# Patient Record
Sex: Female | Born: 1950
Health system: Southern US, Community
[De-identification: ages and names within clinical notes are randomized; demographics above are authoritative.]

## PROBLEM LIST (undated history)

## (undated) DIAGNOSIS — N201 Calculus of ureter: Secondary | ICD-10-CM

## (undated) DIAGNOSIS — G4700 Insomnia, unspecified: Secondary | ICD-10-CM

## (undated) DIAGNOSIS — I255 Ischemic cardiomyopathy: Secondary | ICD-10-CM

## (undated) DIAGNOSIS — E7800 Pure hypercholesterolemia, unspecified: Secondary | ICD-10-CM

## (undated) DIAGNOSIS — D3500 Benign neoplasm of unspecified adrenal gland: Secondary | ICD-10-CM

## (undated) DIAGNOSIS — I236 Thrombosis of atrium, auricular appendage, and ventricle as current complications following acute myocardial infarction: Secondary | ICD-10-CM

## (undated) DIAGNOSIS — F32A Depression, unspecified: Secondary | ICD-10-CM

## (undated) DIAGNOSIS — F329 Major depressive disorder, single episode, unspecified: Secondary | ICD-10-CM

## (undated) DIAGNOSIS — F988 Other specified behavioral and emotional disorders with onset usually occurring in childhood and adolescence: Secondary | ICD-10-CM

## (undated) DIAGNOSIS — M1990 Unspecified osteoarthritis, unspecified site: Secondary | ICD-10-CM

## (undated) DIAGNOSIS — I5042 Chronic combined systolic (congestive) and diastolic (congestive) heart failure: Secondary | ICD-10-CM

## (undated) DIAGNOSIS — R32 Unspecified urinary incontinence: Secondary | ICD-10-CM

## (undated) DIAGNOSIS — I2510 Atherosclerotic heart disease of native coronary artery without angina pectoris: Secondary | ICD-10-CM

## (undated) HISTORY — DX: Chronic combined systolic (congestive) and diastolic (congestive) heart failure: I50.42

## (undated) HISTORY — DX: Insomnia, unspecified: G47.00

## (undated) HISTORY — DX: Unspecified urinary incontinence: R32

## (undated) HISTORY — DX: Major depressive disorder, single episode, unspecified: F32.9

## (undated) HISTORY — DX: Ischemic cardiomyopathy: I25.5

## (undated) HISTORY — DX: Pure hypercholesterolemia, unspecified: E78.00

## (undated) HISTORY — DX: Thrombosis of atrium, auricular appendage, and ventricle as current complications following acute myocardial infarction: I23.6

## (undated) HISTORY — DX: Depression, unspecified: F32.A

## (undated) HISTORY — PX: LUMBAR DISC SURGERY: SHX700

---

## 1971-02-12 HISTORY — PX: CHOLECYSTECTOMY: SHX55

## 1994-02-11 HISTORY — PX: VAGINAL HYSTERECTOMY: SUR661

## 1994-02-11 HISTORY — PX: LUMBAR MICRODISCECTOMY: SHX99

## 1995-02-12 HISTORY — PX: LUMBAR FUSION: SHX111

## 1998-01-07 ENCOUNTER — Encounter: Payer: Self-pay | Admitting: Emergency Medicine

## 1998-01-07 ENCOUNTER — Emergency Department (HOSPITAL_COMMUNITY): Admission: EM | Admit: 1998-01-07 | Discharge: 1998-01-07 | Payer: Self-pay | Admitting: Emergency Medicine

## 1998-02-11 HISTORY — PX: CARDIAC CATHETERIZATION: SHX172

## 1998-09-13 ENCOUNTER — Other Ambulatory Visit: Admission: RE | Admit: 1998-09-13 | Discharge: 1998-09-13 | Payer: Self-pay | Admitting: *Deleted

## 1999-03-11 ENCOUNTER — Emergency Department (HOSPITAL_COMMUNITY): Admission: EM | Admit: 1999-03-11 | Discharge: 1999-03-11 | Payer: Self-pay

## 1999-08-12 ENCOUNTER — Encounter: Payer: Self-pay | Admitting: Emergency Medicine

## 1999-08-12 ENCOUNTER — Emergency Department (HOSPITAL_COMMUNITY): Admission: EM | Admit: 1999-08-12 | Discharge: 1999-08-12 | Payer: Self-pay | Admitting: Emergency Medicine

## 1999-12-19 ENCOUNTER — Other Ambulatory Visit: Admission: RE | Admit: 1999-12-19 | Discharge: 1999-12-19 | Payer: Self-pay | Admitting: *Deleted

## 2000-01-29 ENCOUNTER — Ambulatory Visit (HOSPITAL_COMMUNITY): Admission: RE | Admit: 2000-01-29 | Discharge: 2000-01-29 | Payer: Self-pay | Admitting: Internal Medicine

## 2000-01-29 ENCOUNTER — Encounter (HOSPITAL_BASED_OUTPATIENT_CLINIC_OR_DEPARTMENT_OTHER): Payer: Self-pay | Admitting: Internal Medicine

## 2001-04-28 ENCOUNTER — Emergency Department (HOSPITAL_COMMUNITY): Admission: EM | Admit: 2001-04-28 | Discharge: 2001-04-28 | Payer: Self-pay | Admitting: *Deleted

## 2001-05-01 ENCOUNTER — Emergency Department (HOSPITAL_COMMUNITY): Admission: EM | Admit: 2001-05-01 | Discharge: 2001-05-01 | Payer: Self-pay | Admitting: *Deleted

## 2001-05-05 ENCOUNTER — Emergency Department (HOSPITAL_COMMUNITY): Admission: EM | Admit: 2001-05-05 | Discharge: 2001-05-05 | Payer: Self-pay | Admitting: Emergency Medicine

## 2001-05-18 ENCOUNTER — Emergency Department (HOSPITAL_COMMUNITY): Admission: EM | Admit: 2001-05-18 | Discharge: 2001-05-18 | Payer: Self-pay | Admitting: Emergency Medicine

## 2001-05-27 ENCOUNTER — Emergency Department (HOSPITAL_COMMUNITY): Admission: EM | Admit: 2001-05-27 | Discharge: 2001-05-27 | Payer: Self-pay | Admitting: Internal Medicine

## 2002-02-11 HISTORY — PX: GASTRIC BYPASS: SHX52

## 2002-08-26 ENCOUNTER — Encounter: Admission: RE | Admit: 2002-08-26 | Discharge: 2002-11-24 | Payer: Self-pay | Admitting: Surgery

## 2002-08-31 ENCOUNTER — Encounter: Admission: RE | Admit: 2002-08-31 | Discharge: 2002-08-31 | Payer: Self-pay | Admitting: Surgery

## 2002-08-31 ENCOUNTER — Encounter: Payer: Self-pay | Admitting: Surgery

## 2002-12-06 ENCOUNTER — Encounter: Admission: RE | Admit: 2002-12-06 | Discharge: 2003-03-06 | Payer: Self-pay | Admitting: Surgery

## 2002-12-21 ENCOUNTER — Inpatient Hospital Stay (HOSPITAL_COMMUNITY): Admission: RE | Admit: 2002-12-21 | Discharge: 2002-12-24 | Payer: Self-pay | Admitting: Surgery

## 2003-02-24 ENCOUNTER — Ambulatory Visit (HOSPITAL_COMMUNITY): Admission: RE | Admit: 2003-02-24 | Discharge: 2003-02-24 | Payer: Self-pay | Admitting: Surgery

## 2003-05-10 ENCOUNTER — Encounter: Admission: RE | Admit: 2003-05-10 | Discharge: 2003-08-08 | Payer: Self-pay | Admitting: Obstetrics and Gynecology

## 2003-08-24 ENCOUNTER — Encounter: Admission: RE | Admit: 2003-08-24 | Discharge: 2003-11-22 | Payer: Self-pay | Admitting: Surgery

## 2003-12-06 ENCOUNTER — Ambulatory Visit (HOSPITAL_COMMUNITY): Admission: RE | Admit: 2003-12-06 | Discharge: 2003-12-06 | Payer: Self-pay | Admitting: Surgery

## 2003-12-13 ENCOUNTER — Encounter: Admission: RE | Admit: 2003-12-13 | Discharge: 2003-12-13 | Payer: Self-pay | Admitting: Surgery

## 2004-11-29 ENCOUNTER — Ambulatory Visit (HOSPITAL_COMMUNITY): Admission: RE | Admit: 2004-11-29 | Discharge: 2004-11-29 | Payer: Self-pay | Admitting: Surgery

## 2007-04-14 ENCOUNTER — Other Ambulatory Visit: Admission: RE | Admit: 2007-04-14 | Discharge: 2007-04-14 | Payer: Self-pay | Admitting: Obstetrics and Gynecology

## 2010-03-03 ENCOUNTER — Encounter: Payer: Self-pay | Admitting: Surgery

## 2010-03-04 ENCOUNTER — Encounter: Payer: Self-pay | Admitting: Surgery

## 2010-04-08 ENCOUNTER — Emergency Department (HOSPITAL_COMMUNITY)
Admission: EM | Admit: 2010-04-08 | Discharge: 2010-04-08 | Disposition: A | Discharge status: Home / Self Care | Payer: BC Managed Care – PPO | Attending: Emergency Medicine | Admitting: Emergency Medicine

## 2010-04-08 DIAGNOSIS — S01501A Unspecified open wound of lip, initial encounter: Secondary | ICD-10-CM | POA: Insufficient documentation

## 2010-04-08 DIAGNOSIS — W208XXA Other cause of strike by thrown, projected or falling object, initial encounter: Secondary | ICD-10-CM | POA: Insufficient documentation

## 2010-04-08 DIAGNOSIS — Y92009 Unspecified place in unspecified non-institutional (private) residence as the place of occurrence of the external cause: Secondary | ICD-10-CM | POA: Insufficient documentation

## 2010-06-29 NOTE — Discharge Summary (Signed)
NAME:  Shawna, Sparks                       ACCOUNT NO.:  [id number]   MEDICAL RECORD NO.:  [id number]                   PATIENT TYPE:  INP   LOCATION:  0465                                 FACILITY:  [facility]   PHYSICIAN:  Sandria Bales. Ezzard Standing, M.D.               DATE OF BIRTH:  [birth year]   DATE OF ADMISSION:  12/21/2002  DATE OF DISCHARGE:  12/24/2002                                 DISCHARGE SUMMARY   DISCHARGE DIAGNOSES:  1. Morbid obesity with a body mass index of approximately 42.  2. Gastroesophageal reflux disease.  3. Degenerative disk disease of lumbar and cervical spine.  4. Diabetes mellitus on oral hypoglycemics.  5. Hyperlipidemia.  6. History of depression.  7. Hypertension.   PROCEDURE:  The patient had a laparoscopic ROUX-en-Y gastrojejunostomy and  an esophagogastrotomy on December 21, 2002.   HISTORY OF PRESENT ILLNESS:  Shawna Sparks is a 60 year old white female,  patient of Dr. Jarome Matin, who is morbidly obese. Her weight is 250  pounds, her height approximately 5 feet 4 inches with an estimated BMI of  42. She has completed our bariatric program preoperative evaluation  including psychiatric evaluation, nutritional consultation and laboratory  assessments and comes to the hospital for laparoscopic ROUX-en-Y  gastrojejunostomy.   PAST MEDICAL HISTORY:  Her significant past history is that she had atypical  chest pain with a negative Cardiolite scan by Dr. Verdis Prime.  She has  recently been placed on Glucophage for type 2 diabetes mellitus. She has  gastroesophageal reflux disease, degenerative disk disease of the lumbar and  cervical spines, hypertension and depression.   HOSPITAL COURSE:  She underwent a preoperative diet and bowel prep and on  November 9, was taken to the operating room where she underwent a  laparoscopic ROUX-en-Y gastrojejunostomy. This was done antecolic,  antegastric by me and then she had an esophagogastroscopy at the same  time  by Dr. Luretha Murphy.  In management postoperatively, I asked Dr. Jarome Matin to see her with me because of her diabetes and other medical  problems.   She did well postoperatively. She had a lower extremity Doppler on the first  postoperative day which was negative for any evidence of DVT.  She had upper  GI swallow which showed no evidence of leak.   She was started on liquids and advanced. By the second postoperative day,  she had some nausea, her CBG was 135, her white blood count was 7000,  hemoglobin 10. She had some bruising at the wound sites. Her diabetes was  managed by Dr. Eloise Harman. She was encouraged to ambulate.   By the third postoperative day, she was afebrile, she was tolerating her  liquids and Glucerna well, she had minimal drainage from her Jackson-Pratt  drain and was ready for discharge.   She was given Roxicet for pain at home. She was to discontinue her insulin  and Glucophage upon discharge  and would go from Prevacid orally at home. She  could walk as tolerated. Again she is to take Glucerna q.i.d., she is to  change the dressings as needed. She will see Shawna Sparks __________, her nurse  coordinator, back on the December 27, 2002.   CONDITION ON DISCHARGE:  Good.                                               Sandria Bales. Ezzard Standing, M.D.    DHN/MEDQ  D:  01/11/2003  T:  01/11/2003  Job:  [id number]   cc:   Barry Dienes. Eloise Harman, M.D.  [address]  [place]  [place] [address]  Fax: [phone]   Lyn Records III, M.D.  [address]  [address]  [place]  [place] [address]  Fax: [phone]

## 2010-06-29 NOTE — Op Note (Signed)
NAME:  Shawna Sparks, Shawna Sparks                       ACCOUNT NO.:  [id number]   MEDICAL RECORD NO.:  [id number]                   PATIENT TYPE:  INP   LOCATION:  X001                                 FACILITY:  [facility]   PHYSICIAN:  Sandria Bales. Ezzard Standing, M.D.               DATE OF BIRTH:  [birth year]   DATE OF PROCEDURE:  12/21/2002  DATE OF DISCHARGE:                                 OPERATIVE REPORT   PREOPERATIVE DIAGNOSIS:  Morbid obesity with a body mass index of  approximately 42.   POSTOPERATIVE DIAGNOSES:  1. Morbid obesity with body mass index of 42.  2. Right upper quadrant intra-abdominal adhesions secondary to prior open     cholecystectomy.  3. Fatty infiltration of the liver.   PROCEDURE:  Laparoscopic Roux-en-Y gastrojejunostomy (antecolic,  antegastric), and esophagogastroscopy by Thornton Park. Daphine Deutscher, M.D.   SURGEON:  The main surgery was done by Sandria Bales. Ezzard Standing, M.D., and Thornton Park.  Daphine Deutscher, M.D., was the first assistant.   ANESTHESIA:  General endotracheal.   ESTIMATED BLOOD LOSS:  Minimal.   INDICATION FOR PROCEDURE:  Ms. Dragan is a 60 year old white female, a  patient of Barry Dienes. Eloise Harman, M.D., who is morbidly obese much of her adult  life.  She comes for discussion and evaluation for bariatric surgery.  She  has been through our preoperative training course.  Her weight is  approximately 240 with a height of 5 feet 4 inches, a BMI approximately 42.   Indications and potential complications explained to the patient, potential  complications include but are not limited to bleeding, infection, bowel  leak, blood clots, long-term nutritional consequences.  She understands all  this and now comes for attempted laparoscopic Roux-en-Y gastrojejunostomy.   The patient placed in supine position and given a general endotracheal  anesthetic.  She had Lovenox preoperatively and 1 g of Ancef  perioperatively.  She had PAS stockings in place, NG tube in place, and her  abdomen was prepped with Betadine solution and sterilely draped.   The patient did have some perioperative hyperglycemia, which was controlled  with an insulin drip by anesthesia.  She was not on any medicines for  diabetes preoperatively but had been told that she was a borderline diabetic  or had a glucose intolerance.   The abdomen was prepped with Betadine solution and sterilely draped.  I went  through a trocar site in the left upper quadrant using the Optiview from  [employer] and to get to the abdominal cavity without difficulty.  I then  insufflated the abdomen and carried out an exploration.  The right lobe of  the liver was obstructed by adhesions of the right upper quadrant.  The left  lobe of the liver showed fatty infiltration and was thickened.  The  remainder of the bowel was unremarkable.  I then placed the other trocars.  First I put in a right paramedian trocar to help take  down the adhesions in  the right upper quadrant and give me access to the abdominal cavity.  I then  placed a left upper abdominal paramedian 12 mm trocar, a right upper abdomen  12 mm paramedian trocar, a right subcostal 12 mm trocar, and a left lateral  5 mm trocar.   My first decision was to identify the ligament of Treitz, which was at the  base of the mesentery of the transverse colon.  I then counted the small  bowel down 40 cm and divided that with a GIA stapler quite low, 45 mm.  I  then took down the mesentery to give enough mobility to the small bowel.  I  then counted off the gastric limb, which was 100 cm in length.  I then did a  jejunojejunostomy side to side between the cut gastric limb and the jejunal  limb.  I used a stay suture of 2-0 silk suture.  I then placed an enterotomy  in both of the small bowels.  I used a 45 white load of the [employer] GIA  stapler and fired this.  I then closed the enterotomy with two running 2-0  Vicryl sutures.  I did have to reinforce two areas but I  thought I had a  tight closure at the end.  I then closed the mesentery with a running 2-0  silk suture with a Liga-Tie on both ends of this.   I then placed Tisseel fibrin glue over the anastomosis and along the  mesentery.   I then turned my attention toward the upper abdomen.  I placed a Nathanson  retractor and, again, she had a fatty left lobe of the liver, and got up  around the stomach.   First I developed the angle of His to open this area up.  Then I went along  the lesser curve of the stomach.  We tried to go 5 cm below the  gastroesophageal junction and hug the stomach in the dissection.   I entered into the lesser sac.  I then did six firings of the blue load of  the 45 mm Endo-GIA stapler, first transversely, and the other five were sort  of parallel trying to create a gastric tube.  The tube length was  approximately 5 cm in length and about 3-4 cm in width.  I then inspected.  There was no bleeding at any site in the stomach or the remnant side, and I  then placed Tisseel up in the corner around the new cardia of the stomach,  both the remnant side and the new side.   I then brought up the jejunal limb antecolic, antegastric.   I had marked the jejunal limb with a Penrose drain.  I then sewed the  jejunum to the stomach using a running 2-0 Vicryl suture.  I then cut out  the Penrose drain.  I used an Ewald tube to push into the anterior portion  of the stomach in which I would make my enterotomy.  I then made about a 1  cm hole for my enterotomy, then I made an enterotomy on the side of the  jejunum.  I then did a single firing of the 45 blue stapler.  Actually, I  tried to make the anastomosis about 2.5 or 3 cm in length.   I then closed the gastrojejunostomy with two running 2-0 Vicryl sutures.  I  then placed an anterior layer of 2-0 Vicryl sutures in a running fashion  along the whole anterior wall of the anastomosis.  At this point Dr. Daphine Deutscher went to the head  of the table and did an upper  esophagogastroscopy.  He will dictate this portion of the operation.   Some of the highlights of this is, the patient's GE junction was at about 40  cm.  The gastrojejunal anastomosis about 46 cm, there was a 6 cm pouch as  measured by endoscopy.  There was no bleeding.  The anastomosis was patent,  and we took pictures of this, and also he insufflated air into the stomach  while I flooded the upper abdomen with saline and saw no bubbles or evidence  of leak.  He will dictate the upper endoscopy.   I then irrigated out the upper abdomen.  I placed Tisseel over the anterior  aspect of the anastomosis and the cut jejunum.  I then removed the [facility]  retractor, placed a 19 Blake up into the diaphragm up under the left lobe of  the liver above the anastomosis.   At this time I removed each trocar without difficulty.  I sewed in the drain  with a 2-0 nylon suture.  I stapled each wound and placed some Steri-Strips  across the wound.   The patient tolerated the procedure well.  Her blood sugars, which had been  as high as the 300s, about 250 with the last check.  She will go to a step-  down ICU because of her blood sugars.  Her sponge and needle count were  correct at the end of the case.  She tolerated the procedure well.  Again, I  spent probably 30-45 minutes at the beginning of this case taking down the  adhesions.                                               Sandria Bales. Ezzard Standing, M.D.    DHN/MEDQ  D:  12/21/2002  T:  12/21/2002  Job:  [id number]   cc:   Barry Dienes. Eloise Harman, M.D.  [address]  [place]  [place] [address]  Fax: [phone]   Lyn Records III, M.D.  [address]  [address]  [place]  [place] [address]  Fax: [phone]

## 2010-06-29 NOTE — Consult Note (Signed)
NAME:  Shawna Sparks, Shawna Sparks                       ACCOUNT NO.:  [id number]   MEDICAL RECORD NO.:  [id number]                   PATIENT TYPE:  INP   LOCATION:  0155                                 FACILITY:  [facility]   PHYSICIAN:  Barry Dienes. Eloise Harman, M.D.            DATE OF BIRTH:  12-16-1950   DATE OF CONSULTATION:  12/21/2002  DATE OF DISCHARGE:                                   CONSULTATION   REASON FOR CONSULTATION:  Management of diabetes mellitus, type 2.   REFERRING PHYSICIAN:  Sandria Bales. Ezzard Standing, M.D.   HISTORY OF PRESENT ILLNESS:  The patient is a 60 year old white female who  is well known to me.  She is status post a gastrojejunostomy today for  morbid obesity.  She has diabetes mellitus, type 2, that had been on diet  control and started Glucophage in September 2004.  She had a hemoglobin A1c  level of 7.4% on August 18, 2002.  Her surgery went well and currently she has  the expected level of incisional site soreness, denies shortness of breath,  chest pain, nausea, or headaches.   PAST MEDICAL HISTORY:  1. Significant for diabetes mellitus, type 2, without complications.  2. Hypertension.  3. Obesity.  4. Helicobacter pylorus, 0 positivity, in July 2004.  5. Gastroesophageal reflux disease.  6. Hyperlipidemia.  7. Chronic low back pain.  8. Atypical chest pain with a July 2004 Cardiolite exercise stress test     showing normal left ventricular systolic function and no evidence of     ischemia.  9. Bilateral knee osteoarthritis.  10.      Left shoulder osteoarthritis.  11.      Depression.   MEDICATIONS PRIOR TO ADMISSION:  1. Glucophage XR 500 mg one tab p.o. daily.  2. Meridia 10 mg p.o. daily.  3. Celexa 20 mg p.o. daily.  4. Nexium 40 mg p.o. daily p.r.n.  5. Darvocet-N 100 one tab p.o. t.i.d. p.r.n. pain.  6. Restoril 30 mg p.o. q.h.s. p.r.n. sleep.   ALLERGIES:  1. CODEINE has been associated with respiratory distress.  2. VICODIN has been associated with  rash.   PAST SURGICAL HISTORY:  1. 1996, total abdominal hysterectomy.  2. 1996, lumbar 4/5 diskectomy.  3. 1997, lumbar 4/5 fusion.  4. 2000, cardiac catheterization.   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  She has no history of recent tobacco use and no history of  alcohol abuse.   REVIEW OF SYSTEMS:  See history of present illness.  She is quite happy  about having had her surgery as she has tried numerous diets and medications  to try to lose weight.   PHYSICAL EXAMINATION:  VITAL SIGNS:  Blood pressure 120/80, pulse 92,  respirations 20, temperature 98.4.  GENERAL:  In general she is an overweight white female who was sleepy, but  quite easily arouseable.  HEENT:  Within normal limits.  NECK:  Without jugular venous distention or carotid bruit.  CHEST:  Clear to auscultation.  HEART:  Had a regular rate and rhythm without murmur, gallop, or rub.  ABDOMEN:  Had normal bowel sounds and no hepatosplenomegaly.  EXTREMITIES:  Were without peripheral edema and her feet were sensate to  light touch.   LABORATORY DATA:  Capillary blood glucose levels today were 215, 198, and  171.  Currently she is on IV insulin at 1.7 units per hour.   OTHER MEDICATIONS:  1. Zofran IV as needed for nausea.  2. Dilaudid IV as needed for pain.  3. Half normal saline with 20 mEq of potassium per liter at 125 ml per hour.  4. Ancef q.8h. x3 doses.   IMPRESSION/PLAN:  1. Diabetes mellitus, type 2:  She recently started medications with a rise     in her hemoglobin A1c level.  For now we will convert her to low dose     Lantus with sliding scale insulin as needed.  At 48 hours following     surgery she could restart Glucophage.  Given her gastric reduction     surgery we will need to order the liquid form of Glucophage at relatively     low dose of 250 mg once daily.  2. Gastroesophageal reflux disease:  Stable.  Her symptoms could worsen,     given  her surgery.  Plan to start IV Protonix and continue regular dosing     of Nexium as an outpatient.  3. Osteoarthritis:  Stable on her current regimen.                                               Barry Dienes Eloise Harman, M.D.    DGP/MEDQ  D:  12/21/2002  T:  12/21/2002  Job:  [id number]

## 2010-06-29 NOTE — Op Note (Signed)
   NAME:  BARNEY, GERTSCH                       ACCOUNT NO.:  [id number]   MEDICAL RECORD NO.:  [id number]                   PATIENT TYPE:  INP   LOCATION:  X001                                 FACILITY:  [facility]   PHYSICIAN:  Thornton Park. Daphine Deutscher, M.D.             DATE OF BIRTH:  23-Sep-1950   DATE OF PROCEDURE:  12/21/2002  DATE OF DISCHARGE:                                 OPERATIVE REPORT   PREOPERATIVE DIAGNOSIS:  Morbid obesity, Roux-en-Y gastric bypass in  progress.   ENDOSCOPIST:  Thornton Park. Daphine Deutscher, M.D.   ANESTHESIA:  General.   INDICATIONS:  Wren Glazer is a 60 year old lady undergoing a  laparoscopic Roux-Y gastric bypass and after completion of the  gastrojejunostomy, I went up to the head of the table and inserted the  esophagogastroscope.  This passed into the esophagus under visualization  where the esophagus appeared to be relatively normal.  The EG junction was  at 40 cm from her lips and then the scope was passed into a pouch.  We  insufflated and Dr. Ezzard Standing filled up the abdomen with some saline. There was  no evidence of any bubbles inside the abdomen.  On the inside, there was no  intraluminal evidence of bleeding.  The anastomosis appeared widely patent  and again was examined from within.  Pictures of the anastomosis were taken.  The insufflation was removed and the scope was withdrawn.  The pouch length  appeared to be 6 cm by measurement from the esophagogastric junction.                                               Thornton Park Daphine Deutscher, M.D.    MBM/MEDQ  D:  12/21/2002  T:  12/21/2002  Job:  [id number]

## 2010-09-17 ENCOUNTER — Other Ambulatory Visit (HOSPITAL_COMMUNITY): Payer: Self-pay | Admitting: Orthopedic Surgery

## 2010-09-17 DIAGNOSIS — S20219A Contusion of unspecified front wall of thorax, initial encounter: Secondary | ICD-10-CM

## 2010-09-17 DIAGNOSIS — R0602 Shortness of breath: Secondary | ICD-10-CM

## 2010-09-18 ENCOUNTER — Encounter (HOSPITAL_COMMUNITY): Payer: Self-pay

## 2010-09-18 ENCOUNTER — Other Ambulatory Visit (HOSPITAL_COMMUNITY): Payer: Self-pay | Admitting: Orthopedic Surgery

## 2010-09-18 ENCOUNTER — Ambulatory Visit (HOSPITAL_COMMUNITY)
Admission: RE | Admit: 2010-09-18 | Discharge: 2010-09-18 | Disposition: A | Discharge status: Home / Self Care | Payer: BC Managed Care – PPO | Source: Ambulatory Visit | Attending: Orthopedic Surgery | Admitting: Orthopedic Surgery

## 2010-09-18 DIAGNOSIS — X58XXXA Exposure to other specified factors, initial encounter: Secondary | ICD-10-CM | POA: Insufficient documentation

## 2010-09-18 DIAGNOSIS — S20219A Contusion of unspecified front wall of thorax, initial encounter: Secondary | ICD-10-CM

## 2010-09-18 DIAGNOSIS — E119 Type 2 diabetes mellitus without complications: Secondary | ICD-10-CM | POA: Insufficient documentation

## 2010-09-18 DIAGNOSIS — R0602 Shortness of breath: Secondary | ICD-10-CM

## 2010-09-18 DIAGNOSIS — N289 Disorder of kidney and ureter, unspecified: Secondary | ICD-10-CM | POA: Insufficient documentation

## 2010-09-18 DIAGNOSIS — R079 Chest pain, unspecified: Secondary | ICD-10-CM | POA: Insufficient documentation

## 2010-09-18 HISTORY — DX: Benign neoplasm of unspecified adrenal gland: D35.00

## 2010-09-18 HISTORY — DX: Other specified behavioral and emotional disorders with onset usually occurring in childhood and adolescence: F98.8

## 2010-09-18 MED ORDER — IOHEXOL 300 MG/ML  SOLN
100.0000 mL | Freq: Once | INTRAMUSCULAR | Status: AC | PRN
  Administered 2010-09-18: 100 mL via INTRAVENOUS

## 2010-10-08 ENCOUNTER — Encounter: Payer: Self-pay | Admitting: Cardiovascular Disease

## 2010-10-09 ENCOUNTER — Ambulatory Visit: Payer: BC Managed Care – PPO | Admitting: Cardiovascular Disease

## 2010-12-20 ENCOUNTER — Emergency Department (HOSPITAL_COMMUNITY): Payer: BC Managed Care – PPO

## 2010-12-20 ENCOUNTER — Encounter (HOSPITAL_COMMUNITY): Payer: Self-pay | Admitting: *Deleted

## 2010-12-20 ENCOUNTER — Emergency Department (HOSPITAL_COMMUNITY)
Admission: EM | Admit: 2010-12-20 | Discharge: 2010-12-20 | Disposition: A | Discharge status: Home / Self Care | Payer: BC Managed Care – PPO | Attending: Emergency Medicine | Admitting: Emergency Medicine

## 2010-12-20 DIAGNOSIS — Z9884 Bariatric surgery status: Secondary | ICD-10-CM | POA: Insufficient documentation

## 2010-12-20 DIAGNOSIS — N201 Calculus of ureter: Secondary | ICD-10-CM | POA: Insufficient documentation

## 2010-12-20 DIAGNOSIS — R109 Unspecified abdominal pain: Secondary | ICD-10-CM | POA: Insufficient documentation

## 2010-12-20 DIAGNOSIS — N200 Calculus of kidney: Secondary | ICD-10-CM

## 2010-12-20 DIAGNOSIS — K5730 Diverticulosis of large intestine without perforation or abscess without bleeding: Secondary | ICD-10-CM | POA: Insufficient documentation

## 2010-12-20 DIAGNOSIS — N1330 Unspecified hydronephrosis: Secondary | ICD-10-CM | POA: Insufficient documentation

## 2010-12-20 LAB — URINALYSIS, ROUTINE W REFLEX MICROSCOPIC
Glucose, UA: >1000 mg/dL — AB
Ketones, ur: 40 mg/dL — AB
Nitrite: NEGATIVE
Protein, ur: 100 mg/dL — AB
Specific Gravity, Urine: 1.034 — ABNORMAL HIGH (ref 1.005–1.030)
Urobilinogen, UA: 1 mg/dL (ref 0.0–1.0)
pH: 5.5 (ref 5.0–8.0)

## 2010-12-20 LAB — URINE MICROSCOPIC-ADD ON

## 2010-12-20 LAB — POCT I-STAT, CHEM 8
BUN: 20 mg/dL (ref 6–23)
Calcium, Ion: 1.13 mmol/L (ref 1.12–1.32)
Chloride: 104 mEq/L (ref 96–112)
Creatinine, Ser: 0.9 mg/dL (ref 0.50–1.10)
Glucose, Bld: 286 mg/dL — ABNORMAL HIGH (ref 70–99)
HCT: 43 % (ref 36.0–46.0)
Hemoglobin: 14.6 g/dL (ref 12.0–15.0)
Potassium: 3.8 mEq/L (ref 3.5–5.1)
Sodium: 139 mEq/L (ref 135–145)
TCO2: 25 mmol/L (ref 0–100)

## 2010-12-20 MED ORDER — ONDANSETRON HCL 4 MG PO TABS: 4.0000 mg | ORAL_TABLET | Freq: Three times a day (TID) | ORAL | Status: DC | PRN

## 2010-12-20 MED ORDER — ONDANSETRON 8 MG PO TBDP
8.0000 mg | ORAL_TABLET | Freq: Once | ORAL | Status: AC
  Administered 2010-12-20: 8 mg via ORAL
  Filled 2010-12-20: qty 1

## 2010-12-20 MED ORDER — IBUPROFEN 800 MG PO TABS: 800.0000 mg | ORAL_TABLET | Freq: Three times a day (TID) | ORAL | Status: DC

## 2010-12-20 MED ORDER — MORPHINE SULFATE CR 15 MG PO TB12: ORAL_TABLET | ORAL | Status: DC

## 2010-12-20 MED ORDER — MORPHINE SULFATE 4 MG/ML IJ SOLN: 8.0000 mg | Freq: Once | INTRAMUSCULAR | Status: DC

## 2010-12-20 MED ORDER — MORPHINE SULFATE 4 MG/ML IJ SOLN
4.0000 mg | Freq: Once | INTRAMUSCULAR | Status: AC
  Administered 2010-12-20: 4 mg via INTRAVENOUS
  Filled 2010-12-20: qty 1

## 2010-12-20 MED ORDER — KETOROLAC TROMETHAMINE 30 MG/ML IJ SOLN
30.0000 mg | Freq: Once | INTRAMUSCULAR | Status: AC
  Administered 2010-12-20: 30 mg via INTRAVENOUS
  Filled 2010-12-20: qty 1

## 2010-12-20 MED ORDER — ONDANSETRON HCL 4 MG PO TABS: 4.0000 mg | ORAL_TABLET | Freq: Four times a day (QID) | ORAL | Status: DC

## 2010-12-20 MED ORDER — MORPHINE SULFATE 4 MG/ML IJ SOLN
8.0000 mg | Freq: Once | INTRAMUSCULAR | Status: AC
  Administered 2010-12-20: 8 mg via INTRAMUSCULAR
  Filled 2010-12-20: qty 2

## 2010-12-20 NOTE — ED Notes (Signed)
Patient stable and being discharged home with her daughter.  Patient is walking to car with family.  Patient stable.

## 2010-12-20 NOTE — ED Notes (Signed)
[place], PA notified of pt's continued pain. States she will go see pt shortly.

## 2010-12-20 NOTE — ED Provider Notes (Signed)
Medical screening examination/treatment/procedure(s) were performed by non-physician practitioner and as supervising physician I was immediately available for consultation/collaboration.   Geoffery Lyons, MD 12/20/10 1534

## 2010-12-20 NOTE — ED Provider Notes (Signed)
History     CSN: [id number] Arrival date & time: 12/20/2010  2:17 PM   None     Chief Complaint  Patient presents with  . Flank Pain    (Consider location/radiation/quality/duration/timing/severity/associated sxs/prior treatment) HPI  Past Medical History  Diagnosis Date  . Attention deficit disorder (ADD) t  . Adrenal adenoma t  . Diabetes mellitus   . Depression     Past Surgical History  Procedure Date  . Cardiac catheterization   . Gastric bypass   . Cholecystectomy     No family history on file.  History  Substance Use Topics  . Smoking status: Never Smoker   . Smokeless tobacco: Not on file  . Alcohol Use: Yes     occassional    OB History    Grav Para Term Preterm Abortions TAB SAB Ect Mult Living                  Review of Systems  Allergies  Codeine; Dilaudid; and Tramadol  Home Medications   Current Outpatient Rx  Name Route Sig Dispense Refill  . CITALOPRAM HYDROBROMIDE 40 MG PO TABS Oral Take 80 mg by mouth daily.     . INSULIN DETEMIR 100 UNIT/ML [place] SOLN Subcutaneous Inject 10 Units into the skin at bedtime.     Marland Kitchen LISDEXAMFETAMINE DIMESYLATE 60 MG PO CAPS Oral Take 60 mg by mouth every morning.     Marland Kitchen ZOLPIDEM TARTRATE 10 MG PO TABS Oral Take 10 mg by mouth at bedtime as needed.       BP 173/86  Pulse 67  Temp(Src) 97.9 F (36.6 C) (Oral)  Resp 18  Wt 198 lb (89.812 kg)  SpO2 100%  Physical Exam  ED Course  Procedures (including critical care time)  Labs Reviewed - No data to display No results found.   No diagnosis found.    MDM  Screened for L flank pain since this am.  Nausea and vomiting in triage.  Pain 10/10.  Will gave morphine 8 IM and zofran 8 ODT.       Jethro Bastos, NP 12/20/10 1445

## 2010-12-20 NOTE — ED Provider Notes (Signed)
History     CSN: [id number] Arrival date & time: 12/20/2010  2:17 PM   First MD Initiated Contact with Patient 12/20/10 1559      Chief Complaint  Patient presents with  . Flank Pain    (Consider location/radiation/quality/duration/timing/severity/associated sxs/prior treatment) HPI   Patient is states she has a long-standing history of stone within her right kidney that has been followed by Dr. Annabell Howells in the past presents to emergency department complaining a waxing and waning left flank that started last week that was associated with blood in her urine that she went to go see Dr. Annabell Howells in his office and states she had a KUB at that time did not show aforementioned stone. Patient states the pain resolved until this morning with gradual onset recurrence of left flank pain with increasing pain throughout the day and increasing severity. Patient states due to numerous allergies to narcotic pain meds, she is only able to have morphine. Patient states she uses Tylenol and ibuprofen at home for pain but states this has not improved her pain at all. Denies aggravating or alleviating factors. Denies fevers, chills, abdominal pain, nausea, vomiting, diarrhea, dysuria.  Past Medical History  Diagnosis Date  . Attention deficit disorder (ADD) t  . Adrenal adenoma t  . Diabetes mellitus   . Depression     Past Surgical History  Procedure Date  . Cardiac catheterization   . Gastric bypass   . Cholecystectomy     No family history on file.  History  Substance Use Topics  . Smoking status: Never Smoker   . Smokeless tobacco: Not on file  . Alcohol Use: Yes     occassional    OB History    Grav Para Term Preterm Abortions TAB SAB Ect Mult Living                  Review of Systems  All other systems reviewed and are negative.    Allergies  Codeine; Dilaudid; and Tramadol  Home Medications   Current Outpatient Rx  Name Route Sig Dispense Refill  . CITALOPRAM HYDROBROMIDE 40  MG PO TABS Oral Take 80 mg by mouth daily.     . INSULIN DETEMIR 100 UNIT/ML [place] SOLN Subcutaneous Inject 10 Units into the skin at bedtime.     Marland Kitchen LISDEXAMFETAMINE DIMESYLATE 60 MG PO CAPS Oral Take 60 mg by mouth every morning.     Marland Kitchen ZOLPIDEM TARTRATE 10 MG PO TABS Oral Take 10 mg by mouth at bedtime as needed.       BP 173/86  Pulse 67  Temp(Src) 97.9 F (36.6 C) (Oral)  Resp 18  Wt 198 lb (89.812 kg)  SpO2 100%  Physical Exam  Nursing note and vitals reviewed. Constitutional: She is oriented to person, place, and time. She appears well-developed and well-nourished. No distress.  HENT:  Head: Normocephalic and atraumatic.  Eyes: Conjunctivae and EOM are normal. Pupils are equal, round, and reactive to light.  Neck: Normal range of motion. Neck supple.  Cardiovascular: Normal rate, regular rhythm, normal heart sounds and intact distal pulses.  Exam reveals no gallop and no friction rub.   No murmur heard. Pulmonary/Chest: Effort normal and breath sounds normal. No respiratory distress. She has no wheezes. She has no rales. She exhibits no tenderness.  Abdominal: Bowel sounds are normal. She exhibits no distension and no mass. There is no tenderness. There is CVA tenderness. There is no rebound and no guarding.  Left CVA tenderness to palpation  Musculoskeletal: Normal range of motion. She exhibits no edema and no tenderness.  Neurological: She is alert and oriented to person, place, and time.  Skin: Skin is warm and dry. No rash noted. She is not diaphoretic. No erythema.  Psychiatric: She has a normal mood and affect.    ED Course  Procedures (including critical care time)  5:36 PM Dr.Ottelin, return consult call and are reviewed CT findings with him. Based on CT findings he suggests giving patient's pain controlled and having her followup with Dr. Annabell Howells in office on tomorrow. We'll discuss with patient.  Labs Reviewed  URINALYSIS, ROUTINE W REFLEX MICROSCOPIC - Abnormal;  Notable for the following:    Color, Urine RED (*) BIOCHEMICALS MAY BE AFFECTED BY COLOR   Appearance TURBID (*)    Specific Gravity, Urine 1.034 (*)    Glucose, UA >1000 (*)    Hgb urine dipstick LARGE (*)    Bilirubin Urine MODERATE (*)    Ketones, ur 40 (*)    Protein, ur 100 (*)    Leukocytes, UA SMALL (*)    All other components within normal limits  POCT I-STAT, CHEM 8 - Abnormal; Notable for the following:    Glucose, Bld 286 (*)    All other components within normal limits  URINE MICROSCOPIC-ADD ON - Abnormal; Notable for the following:    Bacteria, UA FEW (*)    Crystals CA OXALATE CRYSTALS (*)    All other components within normal limits   Ct Abdomen Pelvis Wo Contrast  12/20/2010  *RADIOLOGY REPORT*  Clinical Data: Left flank pain.  History of urinary tract calculi. Surgical history includes gastric bypass, cholecystectomy, hysterectomy, and lumbar fusion.  CT ABDOMEN AND PELVIS WITHOUT CONTRAST 12/20/2010:  Technique:  Multidetector CT imaging of the abdomen and pelvis was performed following the standard protocol without intravenous contrast.  Comparison: CT abdomen and pelvis 11/29/2004, and CT abdomen 02/24/2003 and 12/06/2003 [facility].  Findings: Moderate to severe left hydronephrosis and dilation of the proximal left ureter due to an obstructing approximate 7 mm calculus in the mid left ureter.  Left renal edema and left perinephric edema.  No left renal calculi.  No right urinary tract calculi or obstruction.  Stable simple cyst in the mid right kidney; within the limits of the unenhanced technique, no significant focal parenchymal abnormality involving either kidney.  Slight interval increase in size of the low attenuation right adrenal mass, current measurements approximating 3.7 x 2.9 cm (series 2, image 23), previously 3.4 x 2.4 cm on 11/29/2004.  Mass measures 0-12 HU.  Normal unenhanced appearance of the spleen, pancreas, and left adrenal gland.  Moderate  aorto-iliac atherosclerosis without aneurysm.  No significant lymphadenopathy.  Prior gastric bypass with widely patent gastrojejunal anastomosis. Focal dilation of the small bowel at the jejunojejunal anastomosis, indicative of a focal atonic segment, as expected.  Remainder of the small bowel normal in appearance.  Diffuse colonic diverticulosis without evidence of acute diverticulitis.  Normal appendix in the right upper pelvis.  No ascites.  Urinary bladder decompressed and unremarkable.  Uterus surgically absent.  No adnexal masses or free pelvic fluid.  Phleboliths low in the pelvis.  Bone window images demonstrate prior bone graft donor site in the right iliac bone, prior L4-5 fusion without complications, and mild osteopenia.  Visualized lung bases clear. Heart size normal.  IMPRESSION:  1.  Obstructing approximate 7 mm mid left ureteral calculus. 2.  No urinary tract calculi elsewhere  on either side. 3.  Slight interval increase in size of the right adrenal adenoma since October, 2006, measured above. 4.  Diffuse colonic diverticulosis without evidence of acute diverticulitis. 6.  Prior gastric bypass procedure without complications.  Original Report Authenticated By: Arnell Sieving, M.D.     1. Kidney stone   2. Hydronephrosis of left kidney       MDM  7 mm obstructing stone that was discussed with Dr. Irene Pap in both labs and CT findings of patient's pain well controlled here in the ER and follow up with Dr. Annabell Howells in office established. Patient is afebrile in no acute distress.        Lenon Oms [place], [place] 12/21/10 [phone]

## 2010-12-20 NOTE — ED Notes (Signed)
[place], PA notified of pt's continued pain and request to be updated by her.  States she will be in to speak with pt shortly. NAD noted at this time.

## 2010-12-20 NOTE — ED Notes (Signed)
Pt states "saw Dr. Annabell Howells last Monday, I have a hx of kidney stones"; pt is crying & moaning, holding left flank.

## 2010-12-21 ENCOUNTER — Other Ambulatory Visit: Payer: Self-pay | Admitting: Urology

## 2010-12-21 DIAGNOSIS — N201 Calculus of ureter: Secondary | ICD-10-CM | POA: Insufficient documentation

## 2010-12-21 NOTE — ED Provider Notes (Signed)
Evaluation and management procedures were performed by the PA/NP under my supervision/collaboration.    Faylene Allerton D Fujie Dickison, MD 12/21/10 1431 

## 2010-12-22 ENCOUNTER — Encounter (HOSPITAL_COMMUNITY): Payer: Self-pay | Admitting: Emergency Medicine

## 2010-12-26 ENCOUNTER — Encounter (HOSPITAL_BASED_OUTPATIENT_CLINIC_OR_DEPARTMENT_OTHER): Payer: Self-pay | Admitting: *Deleted

## 2010-12-26 ENCOUNTER — Other Ambulatory Visit: Payer: Self-pay | Admitting: Urology

## 2010-12-26 NOTE — H&P (Signed)
Problems  1. Ureteral Stone Left 592.1  History of Present Illness     Shawna Sparks is a 60 yo WF with a 5-33mm left mid ureteral stone.   She had to return to the ER after her initial visit with me and a CT was done that demonstrated the stone.  She has had recurrent pain and is to have a left ureterescopic stone extraction.   Past Medical History Problems  1. History of  Adrenal Cortical Adenoma Right 227.0 2. History of  Arthritis V13.4 3. History of  Depression 311 4. History of  Diabetes Mellitus 250.00 5. History of  Hypercholesterolemia 272.0  Surgical History Problems  1. History of  Cholecystectomy 2. History of  Gastric Surgery 3. History of  Gastric Surgery For Morbid Obesity Gastric Bypass 4. History of  Hysterectomy V45.77 5. History of  Lumbar Vertebral Fusion  Current Meds 1. Ambien 10 MG Oral Tablet; Therapy: (Recorded:05Nov2012) to 2. CeleXA 40 MG Oral Tablet; Therapy: (Recorded:05Nov2012) to 3. Levemir FlexPen 100 UNIT/ML Subcutaneous Solution; Therapy: (Recorded:05Nov2012) to 4. MetFORMIN HCl 500 MG Oral Tablet; Therapy: (Recorded:05Nov2012) to  Allergies Medication  1. Codeine Derivatives 2. HYDROmorphone HCl TABS 3. Tramadol  Family History Problems  1. Sororal history of  Death In The Family Father 2. Sororal history of  Death In The Family Mother 3. Paternal history of  Diabetes Mellitus V18.0 4. Maternal history of  Diabetes Mellitus V18.0 5. Fraternal history of  Diabetes Mellitus V18.0 6. Sororal history of  Diabetes Mellitus V18.0 7. Sororal history of  Diabetes Mellitus V18.0 8. Sororal history of  Diabetes Mellitus V18.0 9. Family history of  Family Health Status Number Of Children 10. Sororal history of  Heart Disease V17.49 11. Sororal history of  Renal Failure  Social History Problems    Alcohol Use   Caffeine Use   Marital History - Divorced V61.03   Never A Smoker   Occupation:   She works at [employer].   Review of  Systems Genitourinary, constitutional, skin, eye, otolaryngeal, hematologic/lymphatic, cardiovascular, pulmonary, endocrine, musculoskeletal, gastrointestinal, neurological and psychiatric system(s) were reviewed and pertinent findings if present are noted.  Genitourinary: nocturia.  Hematologic/Lymphatic: a tendency to easily bruise.  Cardiovascular: leg swelling.  Musculoskeletal: back pain and joint pain.    Vitals Vital Signs [Data Includes: Last 1 Day]  05Nov2012 03:54PM  BMI Calculated: 34.15 BSA Calculated: 1.96 Height: 5 ft 4 in Weight: 200 lb  Blood Pressure: 143 / 94 Temperature: 98.8 F Heart Rate: 96  Physical Exam Constitutional: Well nourished and well developed . No acute distress.  ENT:. The ears and nose are normal in appearance.  Neck: The appearance of the neck is normal and no neck mass is present.  Pulmonary: No respiratory distress and normal respiratory rhythm and effort.  Cardiovascular: Heart rate and rhythm are normal . No peripheral edema.  Abdomen: The abdomen is obese. The abdomen is soft and nontender (with mild SP tenderness). No masses are palpated. No CVA tenderness. No hernias are palpable. No hepatosplenomegaly noted.  Skin: Normal skin turgor, no visible rash and no visible skin lesions.  Neuro/Psych:. Mood and affect are appropriate.    Results/Data  .    Assessment Assessed  1. Ureteral Stone Left 592.1   She has a probable left ureteral stone with intermittant symptoms.   Plan Health Maintenance (V70.0)  1. UA With REFLEX  Done: 05Nov2012 Ureteral Stone (592.1)  2. Tamsulosin HCl 0.4 MG Oral Capsule; TAKE 1 CAPSULE Daily with meals;  Therapy: 05Nov2012  to (Evaluate:05Dec2012); Last Rx:05Nov2012 3. KUB  Done: 05Nov2012 12:00AM 4. Follow-up MD/NP/PA Office  Follow-up  Requested for: 05Nov2012   She is to have a left ureteroscopic stone extraction.  I have reviewed the risks including but not limited to bleeding, infection, ureteral  injury, need for stent or second procedures, thrombotic events and anesthetic complications.  I have outlined the potential succcess rate of the procedure.   Discussion/Summary  CC: Dr. Dossie Arbour.

## 2010-12-26 NOTE — Progress Notes (Signed)
NPO after MN with exception clear liquids until 0830. Pt to arrive 1315. Needs istat and EKG.  May take morphine and zofran if needed w/ sip of water

## 2010-12-27 ENCOUNTER — Ambulatory Visit (HOSPITAL_BASED_OUTPATIENT_CLINIC_OR_DEPARTMENT_OTHER): Payer: BC Managed Care – PPO | Admitting: Anesthesiology

## 2010-12-27 ENCOUNTER — Encounter (HOSPITAL_BASED_OUTPATIENT_CLINIC_OR_DEPARTMENT_OTHER): Payer: Self-pay | Admitting: Anesthesiology

## 2010-12-27 ENCOUNTER — Encounter (HOSPITAL_BASED_OUTPATIENT_CLINIC_OR_DEPARTMENT_OTHER)
Admission: RE | Disposition: A | Discharge status: Home / Self Care | Payer: Self-pay | Source: Ambulatory Visit | Attending: Urology

## 2010-12-27 ENCOUNTER — Other Ambulatory Visit: Payer: Self-pay

## 2010-12-27 ENCOUNTER — Ambulatory Visit (HOSPITAL_BASED_OUTPATIENT_CLINIC_OR_DEPARTMENT_OTHER)
Admission: RE | Admit: 2010-12-27 | Discharge: 2010-12-27 | Disposition: A | Discharge status: Home / Self Care | Payer: BC Managed Care – PPO | Source: Ambulatory Visit | Attending: Urology | Admitting: Urology

## 2010-12-27 DIAGNOSIS — N201 Calculus of ureter: Secondary | ICD-10-CM | POA: Diagnosis present

## 2010-12-27 DIAGNOSIS — E7800 Pure hypercholesterolemia, unspecified: Secondary | ICD-10-CM | POA: Insufficient documentation

## 2010-12-27 DIAGNOSIS — F3289 Other specified depressive episodes: Secondary | ICD-10-CM | POA: Insufficient documentation

## 2010-12-27 DIAGNOSIS — F329 Major depressive disorder, single episode, unspecified: Secondary | ICD-10-CM | POA: Insufficient documentation

## 2010-12-27 DIAGNOSIS — Z79899 Other long term (current) drug therapy: Secondary | ICD-10-CM | POA: Insufficient documentation

## 2010-12-27 DIAGNOSIS — Z8739 Personal history of other diseases of the musculoskeletal system and connective tissue: Secondary | ICD-10-CM | POA: Insufficient documentation

## 2010-12-27 DIAGNOSIS — Z90710 Acquired absence of both cervix and uterus: Secondary | ICD-10-CM | POA: Insufficient documentation

## 2010-12-27 DIAGNOSIS — E119 Type 2 diabetes mellitus without complications: Secondary | ICD-10-CM | POA: Insufficient documentation

## 2010-12-27 HISTORY — DX: Unspecified osteoarthritis, unspecified site: M19.90

## 2010-12-27 HISTORY — PX: CYSTOSCOPY/RETROGRADE/URETEROSCOPY: SHX5316

## 2010-12-27 HISTORY — DX: Calculus of ureter: N20.1

## 2010-12-27 LAB — POCT I-STAT 4, (NA,K, GLUC, HGB,HCT)
HCT: 42 % (ref 36.0–46.0)
Sodium: 142 mEq/L (ref 135–145)

## 2010-12-27 SURGERY — CYSTOSCOPY/RETROGRADE/URETEROSCOPY
Anesthesia: Choice

## 2010-12-27 MED ORDER — LACTATED RINGERS IV SOLN
INTRAVENOUS | Status: DC | PRN
  Administered 2010-12-27: 14:00:00 via INTRAVENOUS

## 2010-12-27 MED ORDER — DEXAMETHASONE SODIUM PHOSPHATE 4 MG/ML IJ SOLN
INTRAMUSCULAR | Status: DC | PRN
  Administered 2010-12-27: 4 mg via INTRAVENOUS

## 2010-12-27 MED ORDER — KETOROLAC TROMETHAMINE 30 MG/ML IJ SOLN
INTRAMUSCULAR | Status: DC | PRN
  Administered 2010-12-27: 30 mg via INTRAVENOUS

## 2010-12-27 MED ORDER — BELLADONNA ALKALOIDS-OPIUM 16.2-60 MG RE SUPP
RECTAL | Status: DC | PRN
  Administered 2010-12-27: 1 via RECTAL

## 2010-12-27 MED ORDER — MIDAZOLAM HCL 5 MG/5ML IJ SOLN
INTRAMUSCULAR | Status: DC | PRN
  Administered 2010-12-27: 2 mg via INTRAVENOUS

## 2010-12-27 MED ORDER — IOHEXOL 350 MG/ML SOLN
INTRAVENOUS | Status: DC | PRN
  Administered 2010-12-27: 50 mL via INTRATHECAL

## 2010-12-27 MED ORDER — URIBEL 118 MG PO CAPS: 1.0000 | ORAL_CAPSULE | Freq: Four times a day (QID) | ORAL | Status: DC | PRN

## 2010-12-27 MED ORDER — LACTATED RINGERS IV SOLN
INTRAVENOUS | Status: DC
  Administered 2010-12-27: 14:00:00 via INTRAVENOUS

## 2010-12-27 MED ORDER — FENTANYL CITRATE 0.05 MG/ML IJ SOLN
INTRAMUSCULAR | Status: DC | PRN
  Administered 2010-12-27 (×2): 25 ug via INTRAVENOUS
  Administered 2010-12-27: 50 ug via INTRAVENOUS
  Administered 2010-12-27: 25 ug via INTRAVENOUS
  Administered 2010-12-27: 50 ug via INTRAVENOUS
  Administered 2010-12-27: 25 ug via INTRAVENOUS

## 2010-12-27 MED ORDER — LIDOCAINE HCL (CARDIAC) 20 MG/ML IV SOLN
INTRAVENOUS | Status: DC | PRN
  Administered 2010-12-27: 100 mg via INTRAVENOUS

## 2010-12-27 MED ORDER — PROMETHAZINE HCL 25 MG/ML IJ SOLN: 6.2500 mg | INTRAMUSCULAR | Status: DC | PRN

## 2010-12-27 MED ORDER — PROPOFOL 10 MG/ML IV EMUL
INTRAVENOUS | Status: DC | PRN
  Administered 2010-12-27: 200 mg via INTRAVENOUS

## 2010-12-27 MED ORDER — FENTANYL CITRATE 0.05 MG/ML IJ SOLN
25.0000 ug | INTRAMUSCULAR | Status: DC | PRN
  Administered 2010-12-27: 25 ug via INTRAVENOUS

## 2010-12-27 MED ORDER — ONDANSETRON HCL 4 MG/2ML IJ SOLN
INTRAMUSCULAR | Status: DC | PRN
  Administered 2010-12-27: 4 mg via INTRAVENOUS

## 2010-12-27 MED ORDER — SODIUM CHLORIDE 0.9 % IR SOLN
Status: DC | PRN
  Administered 2010-12-27: 1

## 2010-12-27 MED ORDER — CIPROFLOXACIN IN D5W 400 MG/200ML IV SOLN
400.0000 mg | INTRAVENOUS | Status: AC
  Administered 2010-12-27: 400 mg via INTRAVENOUS

## 2010-12-27 SURGICAL SUPPLY — 24 items
BAG DRAIN URO-CYSTO SKYTR STRL (DRAIN) ×2 IMPLANT
BAG DRN UROCATH (DRAIN) ×1
BASKET ZERO TIP NITINOL 2.4FR (BASKET) ×1 IMPLANT
BSKT STON RTRVL ZERO TP 2.4FR (BASKET) ×1
CANISTER SUCT LVC 12 LTR MEDI- (MISCELLANEOUS) ×1 IMPLANT
CATH URET 5FR 28IN CONE TIP (BALLOONS)
CATH URET 5FR 28IN OPEN ENDED (CATHETERS) ×2 IMPLANT
CATH URET 5FR 70CM CONE TIP (BALLOONS) IMPLANT
CLOTH BEACON ORANGE TIMEOUT ST (SAFETY) ×2 IMPLANT
DRAPE CAMERA CLOSED 9X96 (DRAPES) ×2 IMPLANT
GLOVE BIOGEL PI IND STRL 6.5 (GLOVE) IMPLANT
GLOVE BIOGEL PI INDICATOR 6.5 (GLOVE) ×2
GLOVE SURG SS PI 8.0 STRL IVOR (GLOVE) ×2 IMPLANT
GOWN STRL REIN XL XLG (GOWN DISPOSABLE) ×2 IMPLANT
GOWN XL W/COTTON TOWEL STD (GOWNS) ×2 IMPLANT
GUIDEWIRE 0.038 PTFE COATED (WIRE) IMPLANT
GUIDEWIRE ANG ZIPWIRE 038X150 (WIRE) IMPLANT
GUIDEWIRE STR DUAL SENSOR (WIRE) ×2 IMPLANT
IV NS IRRIG 3000ML ARTHROMATIC (IV SOLUTION) ×4 IMPLANT
LASER FIBER DISP (UROLOGICAL SUPPLIES) ×1 IMPLANT
NS IRRIG 500ML POUR BTL (IV SOLUTION) IMPLANT
PACK CYSTOSCOPY (CUSTOM PROCEDURE TRAY) ×2 IMPLANT
SHEATH URET ACCESS 12FR/35CM (UROLOGICAL SUPPLIES) ×1 IMPLANT
STENT URET 6FRX24 CONTOUR (STENTS) ×1 IMPLANT

## 2010-12-27 NOTE — Transfer of Care (Signed)
Immediate Anesthesia Transfer of Care Note  Patient: Shawna Sparks  Procedure(s) Performed:  CYSTOSCOPY/RETROGRADE/URETEROSCOPY - cystoscopy left retrograde ureterocopy stone exstraction poss holmium laser  Patient Location: PACU  Anesthesia Type: General  Level of Consciousness: awake, sedated, patient cooperative and responds to stimulation  Airway & Oxygen Therapy: Patient Spontanous Breathing and Patient connected to face mask oxygen  Post-op Assessment: Report given to PACU RN, Post -op Vital signs reviewed and stable and Patient moving all extremities  Post vital signs: Reviewed and stable  Complications: No apparent anesthesia complications

## 2010-12-27 NOTE — Anesthesia Preprocedure Evaluation (Addendum)
Anesthesia Evaluation  Patient identified by MRN, date of birth, ID band Patient awake    Reviewed: Allergy & Precautions, H&P , NPO status , Patient's Chart, lab work & pertinent test results, reviewed documented beta blocker date and time   History of Anesthesia Complications (+) PONV  Airway Mallampati: II TM Distance: >3 FB Neck ROM: full    Dental No notable dental hx. (+)    Pulmonary neg pulmonary ROS,  clear to auscultation  Pulmonary exam normal       Cardiovascular Exercise Tolerance: Good neg cardio ROS regular Normal Prolonged QT   Neuro/Psych PSYCHIATRIC DISORDERS Negative Neurological ROS  Negative Psych ROS   GI/Hepatic negative GI ROS, Neg liver ROS,   Endo/Other  Negative Endocrine ROSDiabetes mellitus-, Well Controlled, Type 2, Insulin Dependent  Renal/GU negative Renal ROS  Genitourinary negative   Musculoskeletal   Abdominal   Peds  Hematology negative hematology ROS (+)   Anesthesia Other Findings   Reproductive/Obstetrics negative OB ROS                         Anesthesia Physical Anesthesia Plan  ASA: III  Anesthesia Plan: General   Post-op Pain Management:    Induction: Intravenous  Airway Management Planned: LMA  Additional Equipment:   Intra-op Plan:   Post-operative Plan:   Informed Consent: I have reviewed the patients History and Physical, chart, labs and discussed the procedure including the risks, benefits and alternatives for the proposed anesthesia with the patient or authorized representative who has indicated his/her understanding and acceptance.   Dental Advisory Given  Plan Discussed with: CRNA and Surgeon  Anesthesia Plan Comments:        Anesthesia Quick Evaluation

## 2010-12-27 NOTE — Anesthesia Postprocedure Evaluation (Signed)
  Anesthesia Post-op Note  Patient: Shawna Sparks  Procedure(s) Performed:  CYSTOSCOPY/RETROGRADE/URETEROSCOPY - cystoscopy left retrograde ureterocopy stone exstraction poss holmium laser  Patient Location: PACU  Anesthesia Type: General  Level of Consciousness: awake and alert   Airway and Oxygen Therapy: Patient Spontanous Breathing  Post-op Pain: mild  Post-op Assessment: Post-op Vital signs reviewed, Patient's Cardiovascular Status Stable, Respiratory Function Stable, Patent Airway and No signs of Nausea or vomiting  Post-op Vital Signs: stable  Complications: No apparent anesthesia complications

## 2010-12-27 NOTE — Anesthesia Procedure Notes (Addendum)
Procedure Name: LMA Insertion Date/Time: 12/27/2010 2:32 PM Performed by: Iline Oven Pre-anesthesia Checklist: Patient identified, Emergency Drugs available, Suction available and Patient being monitored Patient Re-evaluated:Patient Re-evaluated prior to inductionOxygen Delivery Method: Circle System Utilized Preoxygenation: Pre-oxygenation with 100% oxygen Intubation Type: IV induction Ventilation: Mask ventilation without difficulty LMA: LMA inserted LMA Size: 4.0 Number of attempts: 1 Airway Equipment and Method: bite block Placement Confirmation: positive ETCO2 Tube secured with: Tape Dental Injury: Teeth and Oropharynx as per pre-operative assessment

## 2010-12-27 NOTE — Interval H&P Note (Signed)
History and Physical Interval Note:   12/27/2010   1:49 PM   Mickie Bail Hannig  has presented today for surgery, with the diagnosis of left ureteral stone  The various methods of treatment have been discussed with the patient and family. After consideration of risks, benefits and other options for treatment, the patient has consented to  Procedure(s): CYSTOSCOPY/RETROGRADE/URETEROSCOPY as a surgical intervention .  The patients' history has been reviewed, patient examined, no change in status, stable for surgery.  I have reviewed the patients' chart and labs.  Questions were answered to the patient's satisfaction.     Anner Crete  MD

## 2010-12-27 NOTE — Brief Op Note (Signed)
12/27/2010  3:15 PM  PATIENT:  Shawna Sparks  60 y.o. female  PRE-OPERATIVE DIAGNOSIS:  Left ureteral stone  POST-OPERATIVE DIAGNOSIS:  Left ureteral stone  PROCEDURE:  Procedure(s): CYSTOSCOPY/RETROGRADE/URETEROSCOPY WITH HOLMIUM LASERTRIPSY AND INSERTION OF LEFT JJ STENT.  SURGEON:  Surgeon(s): Anner Crete  PHYSICIAN ASSISTANT:   ASSISTANTS: none   ANESTHESIA:   general  EBL:  Total I/O In: 600 [I.V.:600] Out: -   BLOOD ADMINISTERED:none  DRAINS: 6x24 left JJ stent.   LOCAL MEDICATIONS USED:  NONE  SPECIMEN:  Source of Specimen:  stone fragments  DISPOSITION OF SPECIMEN:  To patient  COUNTS:  YES  TOURNIQUET:  * No tourniquets in log *  DICTATION: .Other Dictation: Dictation Number [id number]  PLAN OF CARE: Discharge to home after PACU  PATIENT DISPOSITION:  PACU - hemodynamically stable.   Delay start of Pharmacological VTE agent (>24hrs) due to surgical blood loss or risk of bleeding:  {YES/NO/NOT APPLICABLE:20182

## 2010-12-27 NOTE — Interval H&P Note (Signed)
History and Physical Interval Note:   12/27/2010   7:32 AM   Shawna Sparks  has presented today for surgery, with the diagnosis of left ureteral stone  The various methods of treatment have been discussed with the patient and family. After consideration of risks, benefits and other options for treatment, the patient has consented to  Procedure(s): CYSTOSCOPY/RETROGRADE/URETEROSCOPY as a surgical intervention .  The patients' history has been reviewed, patient examined, no change in status, stable for surgery.  I have reviewed the patients' chart and labs.  Questions were answered to the patient's satisfaction.     Anner Crete  MD

## 2010-12-28 NOTE — Op Note (Signed)
NAMEKIKUYE, KORENEK NO.:  [id number]  MEDICAL RECORD NO.:  [id number]  LOCATION:                               FACILITY:  [facility]  PHYSICIAN:  Excell Seltzer. Annabell Howells, M.D.    DATE OF BIRTH:  1950/09/22  DATE OF PROCEDURE:  12/28/2010 DATE OF DISCHARGE:                              OPERATIVE REPORT   The patient of Dr. Excell Seltzer. Josefine Fuhr.  PROCEDURES:  Cystoscopy, left retrograde pyelogram with interpretation, left ureteroscopic stone extraction with holmium laser lithotripsy, and insertion of 6-French 24 cm left double-J stent.  PREOPERATIVE DIAGNOSIS:  Left midureteral stones.  POSTOPERATIVE DIAGNOSIS:  Left mid and distal ureteral stones.  SURGEON:  Excell Seltzer. Annabell Howells, M.D.  ANESTHESIA:  General.  DRAINS:  6-French 24 cm double-J stent.  SPECIMENS:  Stone fragments given to the patient.  COMPLICATIONS:  None.  INDICATIONS:  Shawna Sparks is a 60 year old white female with a left midureteral stone seen on CT scan who has had persistent pain and hematuria.  She has elected to ureteroscopic stone extraction.  FINDINGS OF PROCEDURE:  She was given Cipro.  She was taken to the operating room, where general anesthetic was induced.  She was placed in lithotomy position.  Her perineum and genitalia were prepped with Betadine solution.  She was draped in usual sterile fashion.  Time-out was performed.  Cystoscopy was performed using a 22-French scope and 12 degree lens. Examination revealed a normal urethra.  The bladder wall was smooth and pale without tumor, stones, or inflammation.  The ureteral orifices were unremarkable.  The left ureteral orifice was cannulated with 5-French open-end catheter, and contrast was instilled.  This revealed a filling defect in the distal ureter, consistent with a stone seen on CT scan.  A guidewire was then passed by the stone to the kidney and the cystoscope was removed.  A 12-French introducer sheath inner core dilator was then  passed by the stone.  The 6.5-French short ureteroscope was then passed alongside the wire. The stone was visualized in an initial attempt to grasp it and remove it with a basket, was unsuccessful, so the basket was disengaged.  Further inspection of the ureter also revealed a second stone in the midureter. A 365 micron laser fiber with a holmium set on 0.5 watts and 20 Hz was then used to fragment both stones.  Once both stones were fragmented, a Nitinol basket was used to remove all significant fragments, some small dust and sand were left that were too small to grasp with the basket.  At this point, the ureteroscope was removed.  The cystoscope was reinserted over the wire and a 6-French 24 cm double-J stent was inserted to the kidney.  Under fluoroscopic guidance, the wire was removed leaving good coil in the kidney, a good coil in the bladder. The bladder was drained.  The scope was removed leaving the stent string exiting urethra.  The drapes removed.  The stent string was secured to the patient's inner thigh.  She was taken down from lithotomy position.  Her anesthetic was reversed.  She was moved to recovery room in stable condition.  There were no complications.     Jonny Ruiz  Ranelle Oyster, M.D.     JJW/MEDQ  D:  12/27/2010  T:  12/27/2010  Job:  [id number]  cc:   Barry Dienes. Eloise Harman, M.D. Fax: [phone]

## 2011-01-01 ENCOUNTER — Encounter (HOSPITAL_BASED_OUTPATIENT_CLINIC_OR_DEPARTMENT_OTHER): Payer: Self-pay | Admitting: Urology

## 2011-07-22 ENCOUNTER — Other Ambulatory Visit: Payer: Self-pay | Admitting: Orthopedic Surgery

## 2011-07-22 ENCOUNTER — Ambulatory Visit
Admission: RE | Admit: 2011-07-22 | Discharge: 2011-07-22 | Disposition: A | Discharge status: Home / Self Care | Payer: BC Managed Care – PPO | Source: Ambulatory Visit | Attending: Orthopedic Surgery | Admitting: Orthopedic Surgery

## 2011-07-22 DIAGNOSIS — M542 Cervicalgia: Secondary | ICD-10-CM

## 2011-07-22 DIAGNOSIS — M5410 Radiculopathy, site unspecified: Secondary | ICD-10-CM

## 2011-07-23 ENCOUNTER — Inpatient Hospital Stay: Admission: RE | Admit: 2011-07-23 | Payer: BC Managed Care – PPO | Source: Ambulatory Visit

## 2012-07-17 ENCOUNTER — Other Ambulatory Visit: Payer: Self-pay | Admitting: Internal Medicine

## 2012-07-17 DIAGNOSIS — Z1231 Encounter for screening mammogram for malignant neoplasm of breast: Secondary | ICD-10-CM

## 2012-08-19 ENCOUNTER — Ambulatory Visit: Payer: BC Managed Care – PPO

## 2014-03-08 ENCOUNTER — Other Ambulatory Visit: Payer: Self-pay | Admitting: Gastroenterology

## 2014-11-25 ENCOUNTER — Ambulatory Visit: Payer: Self-pay | Admitting: Neurology

## 2014-11-28 ENCOUNTER — Ambulatory Visit: Payer: Self-pay | Admitting: Neurology

## 2014-11-29 ENCOUNTER — Ambulatory Visit (INDEPENDENT_AMBULATORY_CARE_PROVIDER_SITE_OTHER): Payer: 59 | Admitting: Neurology

## 2014-11-29 ENCOUNTER — Encounter: Payer: Self-pay | Admitting: Neurology

## 2014-11-29 VITALS — BP 130/96 | HR 101 | Ht 64.0 in | Wt 175.0 lb

## 2014-11-29 DIAGNOSIS — M5412 Radiculopathy, cervical region: Secondary | ICD-10-CM

## 2014-11-29 DIAGNOSIS — E1165 Type 2 diabetes mellitus with hyperglycemia: Secondary | ICD-10-CM

## 2014-11-29 DIAGNOSIS — G255 Other chorea: Secondary | ICD-10-CM

## 2014-11-29 DIAGNOSIS — M542 Cervicalgia: Secondary | ICD-10-CM | POA: Diagnosis not present

## 2014-11-29 DIAGNOSIS — F1010 Alcohol abuse, uncomplicated: Secondary | ICD-10-CM

## 2014-11-29 DIAGNOSIS — R253 Fasciculation: Secondary | ICD-10-CM | POA: Diagnosis not present

## 2014-11-29 DIAGNOSIS — Z794 Long term (current) use of insulin: Secondary | ICD-10-CM

## 2014-11-29 DIAGNOSIS — IMO0002 Reserved for concepts with insufficient information to code with codable children: Secondary | ICD-10-CM

## 2014-11-29 MED ORDER — DIAZEPAM 5 MG PO TABS: ORAL_TABLET | ORAL | Status: DC

## 2014-11-29 NOTE — Progress Notes (Signed)
Note routed to Dr. Philip Aspen.

## 2014-11-29 NOTE — Progress Notes (Signed)
Shawna Sparks was seen today in the movement disorders clinic for neurologic consultation at the request of Donnajean Lopes, MD.  The consultation is for the evaluation of tremor since late July.  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  It is only in the L hand.  It is mostly with use.  She states that it will move non-rhythmically and it is not painful.  She has trouble typing.  She is right hand dominant.  She has some neck pain and states that she has a C6-7 issue.  States that she will go to a Restaurant manager, fast food.  Last imaging of the neck was years ago.  States that she was told previously by friends that she has a neck tremor x years (associated that with celexa and now on Lexapro).  The records that were made available to me were reviewed.  She saw [facility] first because she was having L hand pain.  They did an EMG (I don't have those results) and it apparently only showed mild L CTS.    Tremor: Yes.     Affected by caffeine:  No. (few cups coffee at work)  Affected by alcohol:  No. (drinks at least 8-10 oz vodka or corona light a day - admits drinks too much)  Affected by stress:  No.  Affected by fatigue:  Yes.    Spills soup if on spoon:  No. but states that she can't carry things with that hand  Spills glass of liquid if full:  Yes.    Affects ADL's (tying shoes, brushing teeth, etc):  Yes.   but because the hand is "clumsy"  Other Specific Symptoms:  Voice: more hoarse, softer than in the past Sleep: trouble staying and getting to sleep  Vivid Dreams:  No.  Acting out dreams:  No. Wet Pillows: No. Postural symptoms:  Yes.   (just a little off)  Falls?  Yes.    Bradykinesia symptoms: difficulty regaining balance Loss of smell:  Yes.   Loss of taste:  Yes.   Urinary Incontinence:  No. Difficulty Swallowing:  No. Handwriting, micrographia: No., but handwriting has changed due to cramping Depression:  Yes.   Memory changes:  Yes.  , trouble  with short term memory Hallucinations:  No.  visual distortions: No. N/V:  No. Lightheaded:  No.  Syncope: No. Diplopia:  No. Dyskinesia:  No.  Neuroimaging has not previously been performed.    ALLERGIES:   Allergies  Allergen Reactions  . Codeine Shortness Of Breath  . Ambien [Zolpidem Tartrate] Itching  . Benadryl [Diphenhydramine Hcl (Sleep)] Itching  . Dilaudid [Hydromorphone Hcl] Rash  . Tramadol Rash    CURRENT MEDICATIONS:  Outpatient Encounter Prescriptions as of 11/29/2014  Medication Sig  . escitalopram (LEXAPRO) 20 MG tablet TK 1 T PO QD  . fluconazole (DIFLUCAN) 150 MG tablet TK 1 T PO QD  . lisdexamfetamine (VYVANSE) 60 MG capsule Take 60 mg by mouth every morning.   Marland Kitchen NOVOLOG 100 UNIT/ML injection TK BEFORE EACH MEAL BY SLIDING SCALE  . vitamin B-12 (CYANOCOBALAMIN) 1000 MCG tablet Take 1,000 mcg by mouth daily.  . Vitamin D, Ergocalciferol, (DRISDOL) 50000 UNITS CAPS capsule TK ONE C PO Q WEEK  . zolpidem (AMBIEN CR) 12.5 MG CR tablet TK 1 T PO QD HS PRF SLEEP  . [DISCONTINUED] sertraline (ZOLOFT) 25 MG tablet Take 25 mg by mouth daily.  . [DISCONTINUED] zolpidem (AMBIEN) 10 MG tablet Take 10 mg  by mouth at bedtime.   . diazepam (VALIUM) 5 MG tablet Take one tablet by mouth 30 minutes prior to MR and one additional if needed at time of MR  . [DISCONTINUED] citalopram (CELEXA) 40 MG tablet Take 40 mg by mouth daily.   . [DISCONTINUED] insulin detemir (LEVEMIR) 100 UNIT/ML injection Inject 10 Units into the skin at bedtime.   . [DISCONTINUED] Meth-Hyo-M Bl-Na Phos-Ph Sal (URIBEL) 118 MG CAPS Take 1 capsule (118 mg total) by mouth 4 (four) times daily as needed (bladder irritation).  . [DISCONTINUED] morphine (MS CONTIN) 15 MG 12 hr tablet 15 mg as needed. One-two tablets my mouth every 8 hours as needed for pain   . [DISCONTINUED] ondansetron (ZOFRAN) 4 MG tablet Take 1 tablet (4 mg total) by mouth every 8 (eight) hours as needed for nausea.   No  facility-administered encounter medications on file as of 11/29/2014.    PAST MEDICAL HISTORY:   Past Medical History  Diagnosis Date  . Attention deficit disorder (ADD) t  . Adrenal adenoma right    accidental finding 6 yrs ago-   . Depression   . Diabetes mellitus     insulin  . Arthritis     knees  . Ureteral stone left    PAST SURGICAL HISTORY:   Past Surgical History  Procedure Laterality Date  . Gastric bypass  2004  . Vaginal hysterectomy  1996  . Cholecystectomy  1973  . Cardiac catheterization  2000    wnl  . Lumbar microdiscectomy  1996    L4 - 5  . Lumbar fusion  1997    L 4 - 5  . Cystoscopy/retrograde/ureteroscopy  12/27/2010    Procedure: CYSTOSCOPY/RETROGRADE/URETEROSCOPY;  Surgeon: Malka So;  Location: [facility];  Service: Urology;  Laterality: N/A;  cystoscopy left retrograde ureterocopy stone exstraction poss holmium laser    SOCIAL HISTORY:   Social History   Social History  . Marital Status: Married    Spouse Name: N/A  . Number of Children: N/A  . Years of Education: N/A   Occupational History  . nurse    Social History Main Topics  . Smoking status: Never Smoker   . Smokeless tobacco: Never Used  . Alcohol Use: 0.0 oz/week    0 Standard drinks or equivalent per week     Comment: 8-12 oz a day  . Drug Use: No  . Sexual Activity: Not on file   Other Topics Concern  . Not on file   Social History Narrative    FAMILY HISTORY:   Family Status  Relation Status Death Age  . Mother Deceased     heart disease  . Father Deceased     heart disease, pancreatic cancer  . Brother Deceased     ? MI  . Brother Deceased   . Sister Deceased     sepsis, cardiac transplant  . Child Alive     3, healthy    ROS:  Admits to noncompliant with diet/DM care and DM very uncontrolled.  A complete 10 system review of systems was obtained and was unremarkable apart from what is mentioned above.  PHYSICAL EXAMINATION:     VITALS:   Filed Vitals:   11/29/14 1012  BP: 130/96  Pulse: 101  Height: 5\' 4"  (1.626 m)  Weight: 175 lb (79.379 kg)    GEN:  The patient appears stated age and is in NAD.  Is sweating excessively HEENT:  Normocephalic, atraumatic.  The mucous membranes  are very dry.  No tongue fasciculations. The superficial temporal arteries are without ropiness or tenderness. CV:  RRR Lungs:  CTAB Neck/HEME:  There are no carotid bruits bilaterally.  Neurological examination:  Orientation: The patient is alert and oriented x3. Fund of knowledge is appropriate.  Recent and remote memory are intact.  Attention and concentration are normal.    Able to name objects and repeat phrases. Cranial nerves: There is good facial symmetry. Pupils are equal round and reactive to light bilaterally. Fundoscopic exam reveals clear margins bilaterally. Extraocular muscles are intact. The visual fields are full to confrontational testing. The speech is fluent and clear. Soft palate rises symmetrically and there is no tongue deviation. Hearing is intact to conversational tone. Sensation: Sensation is intact to light and pinprick throughout (facial, trunk, extremities). Vibration is intact at the bilateral big toe. There is no extinction with double simultaneous stimulation. There is no sensory dermatomal level identified. Motor: Strength is 5/5 in the bilateral upper and lower extremities.   Shoulder shrug is equal and symmetric.  There is no pronator drift.  There are a few rare scattered fasciculations over the deltoids bilaterally and rarely in the gastroc.  Did not see fasciculations in back, chest, tongue. Deep tendon reflexes: Deep tendon reflexes are 2-/4 at the bilateral biceps, triceps, brachioradialis, patella and absent at bilateral achilles. Plantar responses are downgoing bilaterally.  Movement examination: Tone: There is normal tone in the bilateral upper extremities.  The tone in the lower extremities is  normal.  Abnormal movements: There is an irregular head tremor.  There is no tremor of the hands.  There is an irregular choreiform like movement of the fingers on the L when she types and intermittently when the hand is held antigravity.   Coordination:  There is no decremation with RAM's, with any form of RAMS, including alternating supination and pronation of the forearm, hand opening and closing, finger taps, heel taps and toe taps. Gait and Station: The patient has no difficulty arising out of a deep-seated chair without the use of the hands. The patient's stride length is normal with normal arm swing and no strange posturing of the arm with ambulation.  Able to stand in romberg position without difficulty  ASSESSMENT/PLAN:  1.  Abnormal movements of the left upper extremity that appear to be somewhat choreiform in nature and came on acutely  -Generally, acute onset of choreiform like movements come from structural lesions.  I have seen this from diabetic hypoglycemia as well as hyperglycemia causing basal ganglia lesions and we will do an MRI of the brain.  She also reports significant neck pain and we will do an MRI of the cervical spine.  -She reports that she recently had lab work from her primary care physician and I will get a copy of those before doing further lab work, but if not already done so, she will need a CBC, CMP, ceruloplasmin, copper, LFTs, TSH, PTH, ferritin, sedimentation rate, ANA, antiphospholipid antibody, lupus anticoagulant, RPR, B12, antigliadin antibody.  Because she has some random fasciculations we will also make sure that we get calcium, phosphorus, SPEP/UPEP with immunofixation.  Will hold on paraneoplastic Ab's and HD panel for now given cost and given acute onset of sx's.    -I know that she just had EMG via ortho office for CTS/radiculopathy, but if we don't get an answer with above testing, may need to repeat in a few months with more detailed EMG. 2.  EtOH  overuse/abuse  -  long discussion re: importance of tapering EtOH 3.  Uncontrolled DM  -talked about importance of proper diet and exercise. 4.  Much greater than 50% of this visit was spent in counseling with the patient.  Total face to face time:  60 min

## 2014-11-29 NOTE — Patient Instructions (Signed)
1. We have scheduled you at [facility] for your OPEN MRI on 12/05/14 at 10:00 am. Please arrive 30 minutes prior and go to [place]. If you need to reschedule for any reason please call [phone].

## 2014-12-02 ENCOUNTER — Telehealth: Payer: Self-pay | Admitting: Neurology

## 2014-12-02 DIAGNOSIS — G255 Other chorea: Secondary | ICD-10-CM

## 2014-12-02 DIAGNOSIS — E538 Deficiency of other specified B group vitamins: Secondary | ICD-10-CM

## 2014-12-02 DIAGNOSIS — R253 Fasciculation: Secondary | ICD-10-CM

## 2014-12-02 NOTE — Telephone Encounter (Signed)
Patient made aware she needs labs drawn. Orders placed. She will stop by our office to get on the lab schedule for [facility] prior to going for labs. She will call with any questions.

## 2014-12-02 NOTE — Telephone Encounter (Signed)
-----   Message from [place], DO sent at 12/01/2014  6:10 PM EDT ----- See me to get labs ordered for her.  Basically needs everything.

## 2014-12-05 ENCOUNTER — Ambulatory Visit: Payer: Self-pay | Admitting: Neurology

## 2014-12-06 ENCOUNTER — Telehealth: Payer: Self-pay | Admitting: Neurology

## 2014-12-06 NOTE — Telephone Encounter (Signed)
I spoke with Lawanda at [facility] and she said that patient has hx of resp distress with codeine so they would not give her the contrast.

## 2014-12-06 NOTE — Telephone Encounter (Signed)
Shawna Sparks, will you call [facility] and find out why they didn't do MRI brain with contrast?  I ordered with and without.

## 2014-12-07 ENCOUNTER — Other Ambulatory Visit (INDEPENDENT_AMBULATORY_CARE_PROVIDER_SITE_OTHER): Payer: 59

## 2014-12-07 DIAGNOSIS — E538 Deficiency of other specified B group vitamins: Secondary | ICD-10-CM

## 2014-12-07 DIAGNOSIS — R253 Fasciculation: Secondary | ICD-10-CM | POA: Diagnosis not present

## 2014-12-07 DIAGNOSIS — G255 Other chorea: Secondary | ICD-10-CM

## 2014-12-07 LAB — HEPATIC FUNCTION PANEL
ALK PHOS: 61 U/L (ref 39–117)
ALT: 15 U/L (ref 0–35)
AST: 13 U/L (ref 0–37)
Albumin: 4.5 g/dL (ref 3.5–5.2)
BILIRUBIN DIRECT: 0.1 mg/dL (ref 0.0–0.3)
BILIRUBIN TOTAL: 0.7 mg/dL (ref 0.2–1.2)
TOTAL PROTEIN: 7.6 g/dL (ref 6.0–8.3)

## 2014-12-07 LAB — COMPREHENSIVE METABOLIC PANEL
ALK PHOS: 61 U/L (ref 39–117)
ALT: 15 U/L (ref 0–35)
AST: 13 U/L (ref 0–37)
Albumin: 4.5 g/dL (ref 3.5–5.2)
BILIRUBIN TOTAL: 0.7 mg/dL (ref 0.2–1.2)
BUN: 21 mg/dL (ref 6–23)
CALCIUM: 10 mg/dL (ref 8.4–10.5)
CO2: 20 meq/L (ref 19–32)
Chloride: 98 mEq/L (ref 96–112)
Creatinine, Ser: 0.73 mg/dL (ref 0.40–1.20)
GFR: 85.24 mL/min (ref >60.00)
Glucose, Bld: 373 mg/dL — ABNORMAL HIGH (ref 70–99)
POTASSIUM: 3.5 meq/L (ref 3.5–5.1)
Sodium: 134 mEq/L — ABNORMAL LOW (ref 135–145)
TOTAL PROTEIN: 7.6 g/dL (ref 6.0–8.3)

## 2014-12-07 LAB — PHOSPHORUS: PHOSPHORUS: 5 mg/dL — AB (ref 2.3–4.6)

## 2014-12-07 LAB — C-REACTIVE PROTEIN: CRP: 0.6 mg/dL (ref 0.5–20.0)

## 2014-12-07 LAB — CBC WITH DIFFERENTIAL/PLATELET
BASOS ABS: 0 10*3/uL (ref 0.0–0.1)
Basophils Relative: 0.5 % (ref 0.0–3.0)
EOS PCT: 0.6 % (ref 0.0–5.0)
Eosinophils Absolute: 0 10*3/uL (ref 0.0–0.7)
HEMATOCRIT: 50.3 % — AB (ref 36.0–46.0)
Hemoglobin: 17.3 g/dL — ABNORMAL HIGH (ref 12.0–15.0)
LYMPHS ABS: 1.3 10*3/uL (ref 0.7–4.0)
LYMPHS PCT: 20.3 % (ref 12.0–46.0)
MCHC: 34.3 g/dL (ref 30.0–36.0)
MCV: 90.1 fl (ref 78.0–100.0)
MONOS PCT: 9.1 % (ref 3.0–12.0)
Monocytes Absolute: 0.6 10*3/uL (ref 0.1–1.0)
NEUTROS ABS: 4.4 10*3/uL (ref 1.4–7.7)
NEUTROS PCT: 69.5 % (ref 43.0–77.0)
PLATELETS: 264 10*3/uL (ref 150.0–400.0)
RBC: 5.59 Mil/uL — ABNORMAL HIGH (ref 3.87–5.11)
RDW: 12.9 % (ref 11.5–15.5)
WBC: 6.4 10*3/uL (ref 4.0–10.5)

## 2014-12-07 LAB — SEDIMENTATION RATE: Sed Rate: 10 mm/hr (ref 0–22)

## 2014-12-07 LAB — VITAMIN B12: VITAMIN B 12: 401 pg/mL (ref 211–911)

## 2014-12-07 LAB — TSH: TSH: 2.16 u[IU]/mL (ref 0.35–4.50)

## 2014-12-07 LAB — FERRITIN: Ferritin: 33.2 ng/mL (ref 10.0–291.0)

## 2014-12-07 LAB — CALCIUM: CALCIUM: 10 mg/dL (ref 8.4–10.5)

## 2014-12-08 LAB — GLIADIN ANTIBODIES, SERUM
Gliadin IgA: 11 Units (ref <20)
Gliadin IgG: 1 Units (ref <20)

## 2014-12-08 LAB — PTH, INTACT AND CALCIUM
CALCIUM: 9.7 mg/dL (ref 8.4–10.5)
PTH: 35 pg/mL (ref 14–64)

## 2014-12-08 LAB — ANA: ANA: NEGATIVE

## 2014-12-08 LAB — RPR

## 2014-12-09 LAB — UIFE/LIGHT CHAINS/TP QN, 24-HR UR
Albumin, U: DETECTED
Alpha 1, Urine: DETECTED — AB
Alpha 2, Urine: DETECTED — AB
BETA UR: DETECTED — AB
GAMMA UR: DETECTED — AB
Total Protein, Urine: 7 mg/dL (ref 5–24)

## 2014-12-09 LAB — RFX PTT-LA W/RFX TO HEX PHASE CONF: PTT-LA SCREEN: 29 s (ref <=40)

## 2014-12-09 LAB — SPEP & IFE WITH QIG
ALPHA-1-GLOBULIN: 0.3 g/dL (ref 0.2–0.3)
ALPHA-2-GLOBULIN: 0.7 g/dL (ref 0.5–0.9)
Albumin ELP: 4.8 g/dL (ref 3.8–4.8)
Beta 2: 0.4 g/dL (ref 0.2–0.5)
Beta Globulin: 0.6 g/dL (ref 0.4–0.6)
GAMMA GLOBULIN: 0.6 g/dL — AB (ref 0.8–1.7)
IGM, SERUM: 79 mg/dL (ref 52–322)
IgA: 280 mg/dL (ref 69–380)
IgG (Immunoglobin G), Serum: 661 mg/dL — ABNORMAL LOW (ref 690–1700)
Total Protein, Serum Electrophoresis: 7.3 g/dL (ref 6.1–8.1)

## 2014-12-09 LAB — ANTIPHOSPHOLOPID AB PANEL
Anticardiolipin IgA: <11 [APL'U]
Beta-2 Glyco I IgG: <9 SGU (ref <=20)
Beta-2-Glycoprotein I IgA: <9 SAU (ref <=20)
Beta-2-Glycoprotein I IgM: <9 SMU (ref <=20)
Phosphatydalserine, IgA: <20 U/mL (ref <20)
Phosphatydalserine, IgM: <25 U/mL (ref <25)

## 2014-12-09 LAB — RFX DRVVT SCR W/RFLX CONF 1:1 MIX: dRVVT Screen: 25 s (ref <=45)

## 2014-12-09 LAB — LUPUS ANTICOAGULANT PANEL

## 2014-12-09 LAB — CERULOPLASMIN: Ceruloplasmin: 35 mg/dL (ref 18–53)

## 2014-12-10 LAB — COPPER, SERUM: COPPER: 142 ug/dL (ref 70–175)

## 2014-12-28 ENCOUNTER — Telehealth: Payer: Self-pay | Admitting: Neurology

## 2014-12-28 NOTE — Telephone Encounter (Signed)
Patient had done at [facility]. Please advise.

## 2014-12-28 NOTE — Telephone Encounter (Signed)
MRI brain - mod number of T2 hyperintensities but no reason for the movements.  However, they did not do the contrast like I asked so could be missing something.  MRI cervical spine - has a disc protrusion at C6-7 and while that may cause neck pain and hyperreflexia, it would not cause movements.

## 2014-12-28 NOTE — Telephone Encounter (Signed)
Patient made aware of MR results.  

## 2014-12-28 NOTE — Telephone Encounter (Signed)
PT called and wanted to know the results of her MRI/Dawn CB# [phone]

## 2015-01-27 ENCOUNTER — Encounter: Payer: Self-pay | Admitting: Neurology

## 2015-01-30 ENCOUNTER — Telehealth: Payer: Self-pay | Admitting: Neurology

## 2015-01-30 NOTE — Telephone Encounter (Signed)
Patient dismissed from [facility] by Wells Guiles Tat DO , effective January 27, 2015. Dismissal letter sent out by certified / registered mail. DAJ  Received signed domestic return receipt verifying delivery of certified letter on March 15, 2015. Article number [id number]

## 2015-05-11 ENCOUNTER — Ambulatory Visit (INDEPENDENT_AMBULATORY_CARE_PROVIDER_SITE_OTHER): Payer: 59 | Admitting: Neurology

## 2015-05-11 ENCOUNTER — Encounter: Payer: Self-pay | Admitting: Neurology

## 2015-05-11 VITALS — BP 114/79 | HR 89 | Resp 16 | Ht 64.0 in | Wt 174.0 lb

## 2015-05-11 DIAGNOSIS — R351 Nocturia: Secondary | ICD-10-CM

## 2015-05-11 DIAGNOSIS — F19982 Other psychoactive substance use, unspecified with psychoactive substance-induced sleep disorder: Secondary | ICD-10-CM | POA: Diagnosis not present

## 2015-05-11 DIAGNOSIS — IMO0002 Reserved for concepts with insufficient information to code with codable children: Secondary | ICD-10-CM

## 2015-05-11 DIAGNOSIS — R682 Dry mouth, unspecified: Secondary | ICD-10-CM

## 2015-05-11 DIAGNOSIS — G4726 Circadian rhythm sleep disorder, shift work type: Secondary | ICD-10-CM

## 2015-05-11 MED ORDER — TRAZODONE HCL 100 MG PO TABS: 100.0000 mg | ORAL_TABLET | Freq: Every day | ORAL | Status: DC

## 2015-05-11 MED ORDER — LISDEXAMFETAMINE DIMESYLATE 60 MG PO CAPS: 60.0000 mg | ORAL_CAPSULE | ORAL | Status: DC

## 2015-05-11 MED ORDER — ZOLPIDEM TARTRATE 10 MG PO TABS: ORAL_TABLET | ORAL | Status: DC

## 2015-05-11 NOTE — Progress Notes (Signed)
SLEEP MEDICINE CLINIC   Provider:  Larey Sparks, M D  Referring Provider: Leanna Battles, MD Primary Care Physician:  Shawna Lopes, MD  Chief Complaint  Patient presents with  . Insomnia    Rm 10. Sleep Consult. Patient states that she has taken Ambien over 5 years and it no longer works for her. She states that she has not slept in last 48 hours. Patient has trouble falling and staying sleep, Works nights 8p-8a.     HPI:  Shawna Sparks , BSN and MSN is a 65 y.o. female , seen here as a referral from Dr. Philip Sparks for An evaluation of chronic insomnia.  The patient is a night shift worker and has been using Ambien for the last 5 years at one time she had to double the dose. She ran out of Ambien earlier last week and now has significant rebound insomnia, reports that she hasn't slept the last 48 hours at all. Shawna Sparks is willing to be evaluated for organic reasons of insomnia, which began when she started working night shifts. There is a component of a circadian rhythm disorder as well. 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 She has also used Vyvanse to allow her to be daytime active, prescribed for a diagnosis of adult attention deficit disorder. This prescription was provided by Dr. Philip Sparks.  The patient underwent weight loss surgery 5 years ago and has lost 85 pounds, she is no longer considered morbidly obese. She has type 2 diabetes which has not improved with the weight loss, she has left hand tremor, history of depression and she is status post cholecystectomy, hysterectomy in 2009 and L4-5 fusion surgery in 2010 by Dr. Pietro Sparks.  Sleep habits are as follows: Her work schedule is from 8 PM to 8:30 AM 3 consecutive nights a week , alternating with a week where she has 5 nights at work and 3 days off. Her plan is to retire by the end of this year but she has reached 65 years at enough years in  the system to provide a pension. When she comes home after work she usually tries to go to sleep by 9:30 AM. She will rises between 5 and 6 PM and uses vyvanse. She uses the Ambien about half an hour before she goes to bed during daytime. On her days off work she reverses her pattern.  She does like to drink 3 glasses of red wine before she goes to bed. This is on days when she doesn't work. Her bedroom is cool, quiet she has background noise by fan, and dark. She sleeps on her side, on 2 pillows. Before her normal fusion surgery she was able to sleep prone. Again she has tried to wean off Vyvanse and replace it with non-branded Adderall but she became tearful and depressed. She has worried about a tremor in her left hand and sometimes hand spasms and elevated tone in her right hand. There is a chance that this is related to to dopaminergic reaction in Vyvanse and other stimulants. I would like for her to be reevaluated when she is in retirement to see if we can first of all successful wean her number to to see the effect on the tremor. I think it is justified to maintain her medication regimen until she successfully completes her career.    Sleep medical history and family sleep history:  None of her parents  or siblings had insomnia problems.   Social history: She had been married for 28 years, and considers herself happily de- married, she has 3 grown daughters, all healthy. She is a lifelong nontobacco user, she has grown to like red wine and we discussed today that it would be helpful for her sleep hygiene to eliminate this. Usually she drinks caffeine only at work while working. 2-3 cups of coffee and one soda are average. Shift work. vyvanse and ambien dependent. Failed adderall.   Review of Systems: Out of a complete 14 system review, the patient complains of only the following symptoms, and all other reviewed systems are negative. Tremor left hand , mild, dystonic. Right hand claws.    Epworth score 1 on vyvanse , Fatigue severity score 46  ,  Geriatric depression score 7   Social History   Social History  . Marital Status: Divorced    Spouse Name: N/A  . Number of Children: 3  . Years of Education: MSN   Occupational History  . nurse    Social History Main Topics  . Smoking status: Never Smoker   . Smokeless tobacco: Never Used  . Alcohol Use: 0.0 oz/week    0 Standard drinks or equivalent per week     Comment: 8-12 oz a day  . Drug Use: No  . Sexual Activity: Not on file   Other Topics Concern  . Not on file   Social History Narrative   Drinks caffeine only when working nights.     Family History  Problem Relation Age of Onset  . Congestive Heart Failure Mother   . Pancreatic cancer Father     Past Medical History  Diagnosis Date  . Attention deficit disorder (ADD) t  . Adrenal adenoma right    accidental finding 6 yrs ago-   . Depression   . Diabetes mellitus     insulin  . Arthritis     knees  . Ureteral stone left  . High cholesterol   . Urinary incontinence   . Atypical chest pain   . Insomnia     Past Surgical History  Procedure Laterality Date  . Gastric bypass  2004  . Vaginal hysterectomy  1996  . Cholecystectomy  1973  . Cardiac catheterization  2000    wnl  . Lumbar microdiscectomy  1996    L4 - 5  . Lumbar fusion  1997    L 4 - 5  . Cystoscopy/retrograde/ureteroscopy  12/27/2010    Procedure: CYSTOSCOPY/RETROGRADE/URETEROSCOPY;  Surgeon: Malka So;  Location: [facility];  Service: Urology;  Laterality: N/A;  cystoscopy left retrograde ureterocopy stone exstraction poss holmium laser  . Lumbar disc surgery      Current Outpatient Prescriptions  Medication Sig Dispense Refill  . cyclobenzaprine (FLEXERIL) 10 MG tablet TK 1 T PO BID PRN  0  . escitalopram (LEXAPRO) 20 MG tablet TK 1 T PO QD  12  . fluconazole (DIFLUCAN) 150 MG tablet TK 1 T PO QD  6  . NOVOLOG 100 UNIT/ML injection TK BEFORE  EACH MEAL BY SLIDING SCALE  12  . vitamin B-12 (CYANOCOBALAMIN) 1000 MCG tablet Take 1,000 mcg by mouth daily.    . Vitamin D, Ergocalciferol, (DRISDOL) 50000 UNITS CAPS capsule TK ONE C PO Q WEEK  3  . VYVANSE 70 MG capsule TK ONE C PO QD  0  . zolpidem (AMBIEN) 10 MG tablet TK 1 T PO HS PRF SLP  3  No current facility-administered medications for this visit.    Allergies as of 05/11/2015 - Review Complete 05/11/2015  Allergen Reaction Noted  . Codeine Shortness Of Breath 12/20/2010  . Ambien [zolpidem tartrate] Itching 11/29/2014  . Benadryl [diphenhydramine hcl (sleep)] Itching 11/29/2014  . Dilaudid [hydromorphone hcl] Rash 12/20/2010  . Tramadol Rash 12/20/2010    Vitals: BP 114/79 mmHg  Pulse 89  Resp 16  Ht 5\' 4"  (1.626 m)  Wt 174 lb (78.926 kg)  BMI 29.85 kg/m2 Last Weight:  Wt Readings from Last 1 Encounters:  05/11/15 174 lb (78.926 kg)   [id number] mass index is 29.85 kg/(m^2).     Last Height:   Ht Readings from Last 1 Encounters:  05/11/15 5\' 4"  (1.626 m)    Physical exam:  General: The patient is awake, alert and appears not in acute distress. The patient is well groomed. Head: Normocephalic, atraumatic. Neck is supple. Mallampati 3,  neck circumference:14.75  Nasal airflow unrestricted ,  Retrognathia is not seen. The patient does not use retainers or dental replacement. Cardiovascular:  Regular rate and rhythm , without  murmurs or carotid bruit, and without distended neck veins. Respiratory: Lungs are clear to auscultation. Skin:  Without evidence of edema, or rash Trunk: BMI is elevated . The patient's posture is erect   Neurologic exam : The patient is awake and alert, oriented to place and time.   Memory subjective described as intact.  Attention span & concentration ability appears normal.  Speech is fluent,  without dysarthria, dysphonia or aphasia.  Mood and affect are appropriate.  Cranial nerves: Pupils are equal and briskly reactive to  light.  Extraocular movements  in vertical and horizontal planes intact and without nystagmus. Visual fields by finger perimetry are intact. Hearing to finger rub intact. Facial sensation intact to fine touch. Facial motor strength is symmetric and tongue and uvula move midline. Shoulder shrug was symmetrical.   Motor exam:   Normal tone except left and right hand - and not constantly present- changing with medication ? symmetric muscle bulk and symmetric strength in all extremities. Sensory:  Fine touch, pinprick and vibration / Proprioception tested in the upper extremities was normal. Coordination: Rapid alternating movements in the fingers/hands was normal.  Finger-to-nose maneuver normal without evidence of ataxia, dysmetria or tremor. Gait and station: Patient walks without assistive device and is able unassisted to climb up to the exam table. Strength within normal limits.Stance is stable and normal.  Deep tendon reflexes: in the  upper and lower extremities are symmetric and intact. Babinski maneuver response is  downgoing.  The patient was advised of the nature of the diagnosed sleep disorder , the treatment options and risks for general a health and wellness arising from not treating the condition.  I spent more than 40  minutes of face to face time with the patient. Greater than 50% of time was spent in counseling and coordination of care. We have discussed the diagnosis and differential and I answered the patient's questions.     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 This has probably worsened due to the use of sleep medication and stimulant medication.  It will be a long  way before she is successfully weaned but I do not want to put her through this, before she retires.  She is only 9 months away from retirement and I would rather maintain her current medications for the time being.  2) She may be a snorer she has a high-grade Mallampati but a normal neck circumference she has an elevated BMI all these are possible red flags. She also is somewhat limited in which position she sleeps and due to her lumbar fusion and general aches and pains. Is more likely that she snores and his apneic at night because she also drinks alcohol several nights a week, and I suggested that feet she will go for a definite period to forego all alcohol and see how this affects her sleep pattern. Alcohol will soften and and weaken the tone of her soft palate and therefore usually increases apnea and snoring. It can also have a rebound effect and increase the insomnia due to early arousals after only 45 hours of sleep. It can also be the cause of nocturia.  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  She drew a tremorgram for me and it shows no tremor  impairment. 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  3) my approach is to allow the patient to continue Vyvanse also be may be able to decrease the dose. I would also suggest that she states the Ambien only on days that she has to sleep in daytime to work at night and she would use trazodone all other days, which is not addictive. She experiences rebound insomnia which was expected after 5 years of Ambien use.    Plan:  Treatment plan and additional workup :  Please remember to try to maintain good sleep hygiene, which means: Keep a regular sleep and wake schedule, try not to exercise or have a meal within 2 hours of your bedtime, try to keep your bedroom conducive  for sleep, that is, cool and dark, without light distractors such as an illuminated alarm clock, and refrain from watching TV right before sleep or in the middle of the night and do not keep the TV or radio on during the night. Also, try not to use or play on electronic devices at bedtime, such as your cell phone, tablet PC or laptop. If you like to read at bedtime on an electronic device, try to dim the background light as much as possible. Do not eat in the middle of the night.   We will request a sleep study. I ordered a daytime sleep study, SPLIT if apnea is present. We will lok for restless limb movements, too    We will look for leg twitching and snoring or sleep apnea.   For chronic insomnia, you are best followed by a psychiatrist and/or sleep psychologist.   We will call you with the sleep study results and make a follow up appointment if needed.      Asencion Partridge Jasemine Nawaz MD  05/11/2015   CC: Shawna Sparks, [address] [place], [place] [id number]

## 2015-05-11 NOTE — Patient Instructions (Signed)
Please remember to try to maintain good sleep hygiene, which means: Keep a regular sleep and wake schedule, try not to exercise or have a meal within 2 hours of your bedtime, try to keep your bedroom conducive for sleep, that is, cool and dark, without light distractors such as an illuminated alarm clock, and refrain from watching TV right before sleep or in the middle of the night and do not keep the TV or radio on during the night. Also, try not to use or play on electronic devices at bedtime, such as your cell phone, tablet PC or laptop. If you like to read at bedtime on an electronic device, try to dim the background light as much as possible. Do not eat in the middle of the night.   We will request a sleep study.    We will look for leg twitching and snoring or sleep apnea.   For chronic insomnia, you are best followed by a psychiatrist and/or sleep psychologist.   We will call you with the sleep study results and make a follow up appointment if needed.   I will ask you to use your Ambien only on days that you have to work the following day and all other nights-days use trazodone 100 mg for sleep induction. This may not work right away but usually works within the next 14 days. It'll also help you to be calm and not anxious about the upcoming day. The goal is to use the Ambien 3 nights and the trazodone 4 nights a week.

## 2015-05-15 ENCOUNTER — Encounter: Payer: Self-pay | Admitting: *Deleted

## 2015-05-31 ENCOUNTER — Telehealth: Payer: Self-pay | Admitting: Neurology

## 2015-05-31 NOTE — Telephone Encounter (Signed)
Cheryl with [facility] is calling regarding the patient. She would like to get a verbal order for the patient to have a Home Sleep Study.

## 2015-05-31 NOTE — Telephone Encounter (Signed)
Dawn, I have never had [facility] call to get a verbal order for a sleep study. Dr. Brett Fairy ordered a split night study. Was this denied?

## 2015-06-06 ENCOUNTER — Telehealth: Payer: Self-pay | Admitting: Neurology

## 2015-06-06 NOTE — Telephone Encounter (Signed)
Ok, will you please follow up with Christella Scheuermann and find out if the authorization has been approved or denied for the sleep study?  Thanks!

## 2015-06-06 NOTE — Telephone Encounter (Signed)
I have received anything on this patient back from [employer] except a response with intake number for the authorization paperwork.

## 2015-07-03 ENCOUNTER — Telehealth: Payer: Self-pay

## 2015-07-03 NOTE — Telephone Encounter (Signed)
PA for vyvanse approved from 07/03/2015 to 07/02/2016.  [facility] notified.

## 2015-07-03 NOTE — Telephone Encounter (Signed)
PA for vyvanse completed and submitted to [facility]. Should have a determination in 3-5 business days.

## 2015-09-12 ENCOUNTER — Other Ambulatory Visit: Payer: Self-pay | Admitting: Neurology

## 2015-09-12 NOTE — Telephone Encounter (Signed)
RX for ambien faxed to [facility]. Received a receipt of confirmation.  

## 2015-09-20 ENCOUNTER — Encounter (HOSPITAL_COMMUNITY)
Admission: EM | Disposition: A | Discharge status: Home / Self Care | Payer: Self-pay | Source: Home / Self Care | Attending: Cardiology

## 2015-09-20 ENCOUNTER — Inpatient Hospital Stay (HOSPITAL_COMMUNITY)
Admission: EM | Admit: 2015-09-20 | Discharge: 2015-09-24 | DRG: 247 | Disposition: A | Discharge status: Home / Self Care | Payer: 59 | Attending: Cardiology | Admitting: Cardiology

## 2015-09-20 ENCOUNTER — Encounter (HOSPITAL_COMMUNITY): Payer: Self-pay | Admitting: Emergency Medicine

## 2015-09-20 DIAGNOSIS — Z8249 Family history of ischemic heart disease and other diseases of the circulatory system: Secondary | ICD-10-CM

## 2015-09-20 DIAGNOSIS — Z9114 Patient's other noncompliance with medication regimen: Secondary | ICD-10-CM | POA: Diagnosis not present

## 2015-09-20 DIAGNOSIS — I2102 ST elevation (STEMI) myocardial infarction involving left anterior descending coronary artery: Secondary | ICD-10-CM | POA: Diagnosis not present

## 2015-09-20 DIAGNOSIS — E785 Hyperlipidemia, unspecified: Secondary | ICD-10-CM | POA: Diagnosis present

## 2015-09-20 DIAGNOSIS — F329 Major depressive disorder, single episode, unspecified: Secondary | ICD-10-CM | POA: Diagnosis present

## 2015-09-20 DIAGNOSIS — E782 Mixed hyperlipidemia: Secondary | ICD-10-CM | POA: Diagnosis present

## 2015-09-20 DIAGNOSIS — I2510 Atherosclerotic heart disease of native coronary artery without angina pectoris: Secondary | ICD-10-CM | POA: Diagnosis not present

## 2015-09-20 DIAGNOSIS — E1159 Type 2 diabetes mellitus with other circulatory complications: Secondary | ICD-10-CM | POA: Diagnosis not present

## 2015-09-20 DIAGNOSIS — I10 Essential (primary) hypertension: Secondary | ICD-10-CM | POA: Diagnosis present

## 2015-09-20 DIAGNOSIS — F988 Other specified behavioral and emotional disorders with onset usually occurring in childhood and adolescence: Secondary | ICD-10-CM | POA: Diagnosis present

## 2015-09-20 DIAGNOSIS — I2109 ST elevation (STEMI) myocardial infarction involving other coronary artery of anterior wall: Principal | ICD-10-CM | POA: Diagnosis present

## 2015-09-20 DIAGNOSIS — I213 ST elevation (STEMI) myocardial infarction of unspecified site: Secondary | ICD-10-CM | POA: Diagnosis present

## 2015-09-20 DIAGNOSIS — E1165 Type 2 diabetes mellitus with hyperglycemia: Secondary | ICD-10-CM | POA: Diagnosis present

## 2015-09-20 DIAGNOSIS — I5021 Acute systolic (congestive) heart failure: Secondary | ICD-10-CM | POA: Diagnosis not present

## 2015-09-20 DIAGNOSIS — Z9884 Bariatric surgery status: Secondary | ICD-10-CM

## 2015-09-20 DIAGNOSIS — Z981 Arthrodesis status: Secondary | ICD-10-CM | POA: Diagnosis not present

## 2015-09-20 DIAGNOSIS — I255 Ischemic cardiomyopathy: Secondary | ICD-10-CM | POA: Diagnosis not present

## 2015-09-20 DIAGNOSIS — Z794 Long term (current) use of insulin: Secondary | ICD-10-CM | POA: Diagnosis not present

## 2015-09-20 DIAGNOSIS — R079 Chest pain, unspecified: Secondary | ICD-10-CM | POA: Diagnosis present

## 2015-09-20 DIAGNOSIS — IMO0002 Reserved for concepts with insufficient information to code with codable children: Secondary | ICD-10-CM | POA: Diagnosis present

## 2015-09-20 DIAGNOSIS — I209 Angina pectoris, unspecified: Secondary | ICD-10-CM

## 2015-09-20 DIAGNOSIS — R931 Abnormal findings on diagnostic imaging of heart and coronary circulation: Secondary | ICD-10-CM | POA: Diagnosis not present

## 2015-09-20 HISTORY — PX: CARDIAC CATHETERIZATION: SHX172

## 2015-09-20 HISTORY — DX: Atherosclerotic heart disease of native coronary artery without angina pectoris: I25.10

## 2015-09-20 LAB — COMPREHENSIVE METABOLIC PANEL
ALBUMIN: 4.1 g/dL (ref 3.5–5.0)
ALT: 15 U/L (ref 14–54)
AST: 18 U/L (ref 15–41)
Alkaline Phosphatase: 59 U/L (ref 38–126)
Anion gap: 15 (ref 5–15)
BUN: 14 mg/dL (ref 6–20)
CHLORIDE: 97 mmol/L — AB (ref 101–111)
CO2: 23 mmol/L (ref 22–32)
CREATININE: 0.71 mg/dL (ref 0.44–1.00)
Calcium: 9.9 mg/dL (ref 8.9–10.3)
GFR calc Af Amer: >60 mL/min (ref >60)
GFR calc non Af Amer: >60 mL/min (ref >60)
Glucose, Bld: 396 mg/dL — ABNORMAL HIGH (ref 65–99)
Potassium: 3.6 mmol/L (ref 3.5–5.1)
SODIUM: 135 mmol/L (ref 135–145)
Total Bilirubin: 0.9 mg/dL (ref 0.3–1.2)
Total Protein: 6.7 g/dL (ref 6.5–8.1)

## 2015-09-20 LAB — PROTIME-INR
INR: 0.95
INR: 0.98
Prothrombin Time: 12.7 seconds (ref 11.4–15.2)
Prothrombin Time: 13 seconds (ref 11.4–15.2)

## 2015-09-20 LAB — CBC
HCT: 46.1 % — ABNORMAL HIGH (ref 36.0–46.0)
HEMATOCRIT: 45.2 % (ref 36.0–46.0)
HEMOGLOBIN: 15.4 g/dL — AB (ref 12.0–15.0)
Hemoglobin: 15.8 g/dL — ABNORMAL HIGH (ref 12.0–15.0)
MCH: 29.8 pg (ref 26.0–34.0)
MCH: 30.2 pg (ref 26.0–34.0)
MCHC: 34.1 g/dL (ref 30.0–36.0)
MCHC: 34.3 g/dL (ref 30.0–36.0)
MCV: 87.4 fL (ref 78.0–100.0)
MCV: 88.1 fL (ref 78.0–100.0)
PLATELETS: 202 10*3/uL (ref 150–400)
Platelets: 236 10*3/uL (ref 150–400)
RBC: 5.17 MIL/uL — ABNORMAL HIGH (ref 3.87–5.11)
RBC: 5.23 MIL/uL — ABNORMAL HIGH (ref 3.87–5.11)
RDW: 12.3 % (ref 11.5–15.5)
RDW: 12.3 % (ref 11.5–15.5)
WBC: 8.6 10*3/uL (ref 4.0–10.5)
WBC: 9.3 10*3/uL (ref 4.0–10.5)

## 2015-09-20 LAB — CREATININE, SERUM
CREATININE: 0.64 mg/dL (ref 0.44–1.00)
GFR calc non Af Amer: >60 mL/min (ref >60)

## 2015-09-20 LAB — POCT I-STAT, CHEM 8
BUN: 15 mg/dL (ref 6–20)
CALCIUM ION: 1.14 mmol/L (ref 1.12–1.23)
CREATININE: 0.5 mg/dL (ref 0.44–1.00)
Chloride: 96 mmol/L — ABNORMAL LOW (ref 101–111)
Glucose, Bld: 402 mg/dL — ABNORMAL HIGH (ref 65–99)
HEMATOCRIT: 46 % (ref 36.0–46.0)
HEMOGLOBIN: 15.6 g/dL — AB (ref 12.0–15.0)
Potassium: 3.6 mmol/L (ref 3.5–5.1)
SODIUM: 135 mmol/L (ref 135–145)
TCO2: 23 mmol/L (ref 0–100)

## 2015-09-20 LAB — DIFFERENTIAL
BASOS ABS: 0 10*3/uL (ref 0.0–0.1)
Basophils Relative: 0 %
Eosinophils Absolute: 0.1 10*3/uL (ref 0.0–0.7)
Eosinophils Relative: 1 %
LYMPHS ABS: 1.4 10*3/uL (ref 0.7–4.0)
LYMPHS PCT: 16 %
MONOS PCT: 6 %
Monocytes Absolute: 0.5 10*3/uL (ref 0.1–1.0)
NEUTROS ABS: 6.6 10*3/uL (ref 1.7–7.7)
Neutrophils Relative %: 77 %

## 2015-09-20 LAB — TROPONIN I
Troponin I: 0.08 ng/mL (ref <0.03)
Troponin I: 57.17 ng/mL (ref <0.03)

## 2015-09-20 LAB — GLUCOSE, CAPILLARY
GLUCOSE-CAPILLARY: 283 mg/dL — AB (ref 65–99)
GLUCOSE-CAPILLARY: 297 mg/dL — AB (ref 65–99)

## 2015-09-20 LAB — APTT: APTT: 24 s (ref 24–36)

## 2015-09-20 LAB — LIPID PANEL
CHOL/HDL RATIO: 5.4 ratio
Cholesterol: 277 mg/dL — ABNORMAL HIGH (ref 0–200)
HDL: 51 mg/dL (ref >40)
LDL Cholesterol: 187 mg/dL — ABNORMAL HIGH (ref 0–99)
Triglycerides: 195 mg/dL — ABNORMAL HIGH (ref <150)
VLDL: 39 mg/dL (ref 0–40)

## 2015-09-20 LAB — MRSA PCR SCREENING: MRSA by PCR: NEGATIVE

## 2015-09-20 LAB — POCT ACTIVATED CLOTTING TIME: Activated Clotting Time: >1000 seconds

## 2015-09-20 LAB — POCT I-STAT TROPONIN I: Troponin i, poc: 0.07 ng/mL (ref 0.00–0.08)

## 2015-09-20 LAB — TSH: TSH: 2.613 u[IU]/mL (ref 0.350–4.500)

## 2015-09-20 SURGERY — LEFT HEART CATH AND CORONARY ANGIOGRAPHY

## 2015-09-20 MED ORDER — LIDOCAINE HCL (PF) 1 % IJ SOLN
INTRAMUSCULAR | Status: DC | PRN
  Administered 2015-09-20: 2 mL

## 2015-09-20 MED ORDER — HEPARIN SODIUM (PORCINE) 5000 UNIT/ML IJ SOLN
4000.0000 [IU] | INTRAMUSCULAR | Status: AC
  Administered 2015-09-20: 4000 [IU] via INTRAVENOUS
  Filled 2015-09-20: qty 1

## 2015-09-20 MED ORDER — ENSURE ENLIVE PO LIQD: 237.0000 mL | Freq: Two times a day (BID) | ORAL | Status: DC

## 2015-09-20 MED ORDER — ASPIRIN 81 MG PO CHEW
81.0000 mg | CHEWABLE_TABLET | Freq: Every day | ORAL | Status: DC
  Administered 2015-09-21 – 2015-09-24 (×4): 81 mg via ORAL
  Filled 2015-09-20 (×4): qty 1

## 2015-09-20 MED ORDER — HEPARIN (PORCINE) IN NACL 2-0.9 UNIT/ML-% IJ SOLN
INTRAMUSCULAR | Status: AC
  Filled 2015-09-20: qty 1000

## 2015-09-20 MED ORDER — TRAZODONE HCL 100 MG PO TABS
100.0000 mg | ORAL_TABLET | Freq: Every day | ORAL | Status: DC
  Administered 2015-09-20 – 2015-09-23 (×3): 100 mg via ORAL
  Filled 2015-09-20 (×2): qty 2
  Filled 2015-09-20: qty 1

## 2015-09-20 MED ORDER — ONDANSETRON HCL 4 MG/2ML IJ SOLN
INTRAMUSCULAR | Status: DC | PRN
  Administered 2015-09-20: 4 mg via INTRAVENOUS

## 2015-09-20 MED ORDER — SODIUM CHLORIDE 0.9% FLUSH
3.0000 mL | Freq: Two times a day (BID) | INTRAVENOUS | Status: DC
  Administered 2015-09-23: 3 mL via INTRAVENOUS

## 2015-09-20 MED ORDER — SODIUM CHLORIDE 0.9 % IV SOLN: 250.0000 mL | INTRAVENOUS | Status: DC | PRN

## 2015-09-20 MED ORDER — SODIUM CHLORIDE 0.9 % WEIGHT BASED INFUSION
3.0000 mL/kg/h | INTRAVENOUS | Status: AC
  Administered 2015-09-20: 3 mL/kg/h via INTRAVENOUS

## 2015-09-20 MED ORDER — ONDANSETRON HCL 4 MG/2ML IJ SOLN
INTRAMUSCULAR | Status: AC
  Filled 2015-09-20: qty 2

## 2015-09-20 MED ORDER — ONDANSETRON HCL 4 MG/2ML IJ SOLN: 4.0000 mg | Freq: Four times a day (QID) | INTRAMUSCULAR | Status: DC | PRN

## 2015-09-20 MED ORDER — SODIUM CHLORIDE 0.9% FLUSH: 3.0000 mL | INTRAVENOUS | Status: DC | PRN

## 2015-09-20 MED ORDER — ZOLPIDEM TARTRATE 5 MG PO TABS
5.0000 mg | ORAL_TABLET | Freq: Every evening | ORAL | Status: DC | PRN
  Administered 2015-09-20 – 2015-09-21 (×2): 5 mg via ORAL
  Filled 2015-09-20 (×2): qty 1

## 2015-09-20 MED ORDER — ACETAMINOPHEN 325 MG PO TABS: 650.0000 mg | ORAL_TABLET | ORAL | Status: DC | PRN

## 2015-09-20 MED ORDER — ESCITALOPRAM OXALATE 20 MG PO TABS
20.0000 mg | ORAL_TABLET | Freq: Every day | ORAL | Status: DC
  Administered 2015-09-20 – 2015-09-21 (×2): 20 mg via ORAL
  Filled 2015-09-20 (×2): qty 2

## 2015-09-20 MED ORDER — IOPAMIDOL (ISOVUE-370) INJECTION 76%
INTRAVENOUS | Status: DC | PRN
  Administered 2015-09-20: 200 mL

## 2015-09-20 MED ORDER — ENOXAPARIN SODIUM 40 MG/0.4ML ~~LOC~~ SOLN
40.0000 mg | SUBCUTANEOUS | Status: DC
  Administered 2015-09-21: 40 mg via SUBCUTANEOUS
  Filled 2015-09-20: qty 0.4

## 2015-09-20 MED ORDER — NITROGLYCERIN 1 MG/10 ML FOR IR/CATH LAB
INTRA_ARTERIAL | Status: DC | PRN
  Administered 2015-09-20 (×2): 200 ug via INTRACORONARY

## 2015-09-20 MED ORDER — TICAGRELOR 90 MG PO TABS
90.0000 mg | ORAL_TABLET | Freq: Two times a day (BID) | ORAL | Status: DC
  Administered 2015-09-20 – 2015-09-24 (×8): 90 mg via ORAL
  Filled 2015-09-20 (×8): qty 1

## 2015-09-20 MED ORDER — VERAPAMIL HCL 2.5 MG/ML IV SOLN
INTRAVENOUS | Status: DC | PRN
  Administered 2015-09-20: 10 mL via INTRA_ARTERIAL

## 2015-09-20 MED ORDER — ASPIRIN 81 MG PO CHEW: 324.0000 mg | CHEWABLE_TABLET | ORAL | Status: AC

## 2015-09-20 MED ORDER — SODIUM CHLORIDE 0.9 % IV SOLN
INTRAVENOUS | Status: DC | PRN
  Administered 2015-09-20: 1.75 mg/kg/h via INTRAVENOUS

## 2015-09-20 MED ORDER — SODIUM CHLORIDE 0.9% FLUSH
3.0000 mL | Freq: Two times a day (BID) | INTRAVENOUS | Status: DC
  Administered 2015-09-20 – 2015-09-23 (×4): 3 mL via INTRAVENOUS

## 2015-09-20 MED ORDER — ACETAMINOPHEN 325 MG PO TABS
650.0000 mg | ORAL_TABLET | ORAL | Status: DC | PRN
  Administered 2015-09-20: 650 mg via ORAL
  Filled 2015-09-20: qty 2

## 2015-09-20 MED ORDER — VITAMIN B-12 1000 MCG PO TABS
1000.0000 ug | ORAL_TABLET | Freq: Every day | ORAL | Status: DC
  Administered 2015-09-20 – 2015-09-24 (×5): 1000 ug via ORAL
  Filled 2015-09-20 (×5): qty 1

## 2015-09-20 MED ORDER — IOPAMIDOL (ISOVUE-370) INJECTION 76%
INTRAVENOUS | Status: AC
  Filled 2015-09-20: qty 100

## 2015-09-20 MED ORDER — TICAGRELOR 90 MG PO TABS
ORAL_TABLET | ORAL | Status: AC
  Filled 2015-09-20: qty 2

## 2015-09-20 MED ORDER — ATORVASTATIN CALCIUM 80 MG PO TABS
80.0000 mg | ORAL_TABLET | Freq: Every day | ORAL | Status: DC
  Administered 2015-09-20 – 2015-09-23 (×4): 80 mg via ORAL
  Filled 2015-09-20 (×4): qty 1

## 2015-09-20 MED ORDER — NITROGLYCERIN 0.4 MG SL SUBL
0.4000 mg | SUBLINGUAL_TABLET | SUBLINGUAL | Status: AC | PRN
  Administered 2015-09-20 (×3): 0.4 mg via SUBLINGUAL
  Filled 2015-09-20: qty 1

## 2015-09-20 MED ORDER — HEPARIN (PORCINE) IN NACL 2-0.9 UNIT/ML-% IJ SOLN
INTRAMUSCULAR | Status: DC | PRN
  Administered 2015-09-20: 13:00:00

## 2015-09-20 MED ORDER — INSULIN GLARGINE 100 UNIT/ML ~~LOC~~ SOLN
10.0000 [IU] | Freq: Every day | SUBCUTANEOUS | Status: DC
  Administered 2015-09-20 – 2015-09-21 (×2): 10 [IU] via SUBCUTANEOUS
  Filled 2015-09-20 (×2): qty 0.1

## 2015-09-20 MED ORDER — MIDAZOLAM HCL 2 MG/2ML IJ SOLN
INTRAMUSCULAR | Status: AC
  Filled 2015-09-20: qty 2

## 2015-09-20 MED ORDER — NITROGLYCERIN 1 MG/10 ML FOR IR/CATH LAB
INTRA_ARTERIAL | Status: AC
  Filled 2015-09-20: qty 10

## 2015-09-20 MED ORDER — NITROGLYCERIN IN D5W 200-5 MCG/ML-% IV SOLN: 5.0000 ug/min | INTRAVENOUS | Status: DC

## 2015-09-20 MED ORDER — VERAPAMIL HCL 2.5 MG/ML IV SOLN
INTRAVENOUS | Status: AC
  Filled 2015-09-20: qty 2

## 2015-09-20 MED ORDER — TICAGRELOR 90 MG PO TABS
ORAL_TABLET | ORAL | Status: DC | PRN
  Administered 2015-09-20: 180 mg via ORAL

## 2015-09-20 MED ORDER — FENTANYL CITRATE (PF) 100 MCG/2ML IJ SOLN
INTRAMUSCULAR | Status: DC | PRN
  Administered 2015-09-20: 50 ug via INTRAVENOUS

## 2015-09-20 MED ORDER — FENTANYL CITRATE (PF) 100 MCG/2ML IJ SOLN
INTRAMUSCULAR | Status: AC
  Filled 2015-09-20: qty 2

## 2015-09-20 MED ORDER — BIVALIRUDIN 250 MG IV SOLR
INTRAVENOUS | Status: AC
  Filled 2015-09-20: qty 250

## 2015-09-20 MED ORDER — BIVALIRUDIN BOLUS VIA INFUSION - CUPID
INTRAVENOUS | Status: DC | PRN
  Administered 2015-09-20: 57.825 mg via INTRAVENOUS

## 2015-09-20 MED ORDER — INSULIN ASPART 100 UNIT/ML ~~LOC~~ SOLN
0.0000 [IU] | Freq: Three times a day (TID) | SUBCUTANEOUS | Status: DC
  Administered 2015-09-20 – 2015-09-21 (×2): 8 [IU] via SUBCUTANEOUS
  Administered 2015-09-21: 5 [IU] via SUBCUTANEOUS
  Administered 2015-09-21: 8 [IU] via SUBCUTANEOUS
  Administered 2015-09-22: 3 [IU] via SUBCUTANEOUS
  Administered 2015-09-22: 5 [IU] via SUBCUTANEOUS
  Administered 2015-09-22: 15 [IU] via SUBCUTANEOUS
  Administered 2015-09-23 (×2): 5 [IU] via SUBCUTANEOUS
  Administered 2015-09-23: 8 [IU] via SUBCUTANEOUS
  Administered 2015-09-24: 5 [IU] via SUBCUTANEOUS

## 2015-09-20 MED ORDER — CARVEDILOL 3.125 MG PO TABS
3.1250 mg | ORAL_TABLET | Freq: Two times a day (BID) | ORAL | Status: DC
  Administered 2015-09-20 – 2015-09-24 (×8): 3.125 mg via ORAL
  Filled 2015-09-20 (×7): qty 1

## 2015-09-20 MED ORDER — BOOST / RESOURCE BREEZE PO LIQD
1.0000 | Freq: Three times a day (TID) | ORAL | Status: DC
  Administered 2015-09-20 (×2): 1 via ORAL

## 2015-09-20 MED ORDER — MIDAZOLAM HCL 2 MG/2ML IJ SOLN
INTRAMUSCULAR | Status: DC | PRN
  Administered 2015-09-20: 1 mg via INTRAVENOUS

## 2015-09-20 MED ORDER — ASPIRIN 300 MG RE SUPP: 300.0000 mg | RECTAL | Status: AC

## 2015-09-20 MED ORDER — LIDOCAINE HCL (PF) 1 % IJ SOLN
INTRAMUSCULAR | Status: AC
  Filled 2015-09-20: qty 30

## 2015-09-20 MED ORDER — SODIUM CHLORIDE 0.9 % IV SOLN
10.0000 mL/h | INTRAVENOUS | Status: DC
  Administered 2015-09-20: 10 mL/h via INTRAVENOUS

## 2015-09-20 MED ORDER — HEPARIN (PORCINE) IN NACL 100-0.45 UNIT/ML-% IJ SOLN
850.0000 [IU]/h | INTRAMUSCULAR | Status: DC
  Filled 2015-09-20: qty 250

## 2015-09-20 SURGICAL SUPPLY — 21 items
BALLN EMERGE MR 2.25X12 (BALLOONS) ×3
BALLN EMERGE MR 2.5X15 (BALLOONS) ×3
BALLN ~~LOC~~ EUPHORA RX 2.5X20 (BALLOONS) ×3
BALLN ~~LOC~~ TREK RX 3.0X8 (BALLOONS) ×3
BALLOON EMERGE MR 2.25X12 (BALLOONS) IMPLANT
BALLOON EMERGE MR 2.5X15 (BALLOONS) IMPLANT
BALLOON ~~LOC~~ EUPHORA RX 2.5X20 (BALLOONS) IMPLANT
BALLOON ~~LOC~~ TREK RX 3.0X8 (BALLOONS) IMPLANT
CATH INFINITI 5FR ANG PIGTAIL (CATHETERS) ×2 IMPLANT
CATH INFINITI JR4 5F (CATHETERS) ×3 IMPLANT
CATH VISTA GUIDE 6FR XBLAD3.5 (CATHETERS) ×2 IMPLANT
DEVICE RAD COMP TR BAND LRG (VASCULAR PRODUCTS) ×2 IMPLANT
GLIDESHEATH SLEND SS 6F .021 (SHEATH) ×2 IMPLANT
KIT HEART LEFT (KITS) ×3 IMPLANT
PACK CARDIAC CATHETERIZATION (CUSTOM PROCEDURE TRAY) ×3 IMPLANT
STENT SYNERGY DES 2.5X38 (Permanent Stent) ×2 IMPLANT
SYR MEDRAD MARK V 150ML (SYRINGE) ×3 IMPLANT
TRANSDUCER W/STOPCOCK (MISCELLANEOUS) ×3 IMPLANT
TUBING CIL FLEX 10 FLL-RA (TUBING) ×3 IMPLANT
WIRE ASAHI PROWATER 180CM (WIRE) ×2 IMPLANT
WIRE SAFE-T 1.5MM-J .035X260CM (WIRE) ×2 IMPLANT

## 2015-09-20 NOTE — H&P (Signed)
Patient ID: Shawna Sparks MRN: [id number], DOB/AGE: 1950/08/22   Admit date: 09/20/2015   Primary Physician: Donnajean Lopes, MD Primary Cardiologist: New Reason for admission: anterior STEMI  Pt. Profile:  Shawna Sparks is a 65 y.o. female with a history of uncontrolled IDDM, obesity, HTN, HLD and no prior cardiac history who presented to [facility] today with chest pain and found to have anterolateral STEMI.  Per patient, she has no history of hypertension or hyperlipidemia and takes no medications for this; although these diagnoses are reported in her chart. She does have a long history of diabetes which, in her own words, "is very uncontrolled and she is noncompliant with her insulin." Patient is a previous CCU nurse.   Around 9 AM this morning she started having crushing chest pain that radiated into the left arm associated with N/V and diaphoresis. EMS was called. ECG showed anterloateral ST elevation  and a code STEMI was called.  She does have a strong family history of cardiac disease. Patient is a nonsmoker and denies alcohol abuse. No prior history of heart attack, never had this Pain before. No recent surgery, prolonged bed rest, unilateral leg swelling or calf pain, no prior PE or DVT.   Problem List  Past Medical History:  Diagnosis Date  . Adrenal adenoma right   accidental finding 6 yrs ago-   . Arthritis    knees  . Attention deficit disorder (ADD) t  . Atypical chest pain   . Depression   . Diabetes mellitus    insulin  . High cholesterol   . Insomnia   . Ureteral stone left  . Urinary incontinence     Past Surgical History:  Procedure Laterality Date  . CARDIAC CATHETERIZATION  2000   wnl  . CHOLECYSTECTOMY  1973  . CYSTOSCOPY/RETROGRADE/URETEROSCOPY  12/27/2010   Procedure: CYSTOSCOPY/RETROGRADE/URETEROSCOPY;  Surgeon: Malka So;  Location: [place];  Service: Urology;  Laterality: N/A;  cystoscopy left  retrograde ureterocopy stone exstraction poss holmium laser  . GASTRIC BYPASS  2004  . LUMBAR DISC SURGERY    . LUMBAR FUSION  1997   L 4 - 5  . LUMBAR MICRODISCECTOMY  1996   L4 - 5  . VAGINAL HYSTERECTOMY  1996     Allergies  Allergies  Allergen Reactions  . Codeine Shortness Of Breath  . Ambien [Zolpidem Tartrate] Itching  . Benadryl [Diphenhydramine Hcl (Sleep)] Itching  . Dilaudid [Hydromorphone Hcl] Rash  . Tramadol Rash     Home Medications  Prior to Admission medications   Medication Sig Start Date End Date Taking? Authorizing Provider  cyclobenzaprine (FLEXERIL) 10 MG tablet TK 1 T PO BID PRN 04/17/15   Historical Provider, MD  escitalopram (LEXAPRO) 20 MG tablet TK 1 T PO QD 11/11/14   Historical Provider, MD  fluconazole (DIFLUCAN) 150 MG tablet TK 1 T PO QD 11/11/14   Historical Provider, MD  lisdexamfetamine (VYVANSE) 60 MG capsule Take 1 capsule (60 mg total) by mouth every morning. 05/11/15   Carmen Dohmeier, MD  NOVOLOG 100 UNIT/ML injection TK BEFORE EACH MEAL BY SLIDING SCALE 11/11/14   Historical Provider, MD  traZODone (DESYREL) 100 MG tablet Take 1 tablet (100 mg total) by mouth at bedtime. 05/11/15   Asencion Partridge Dohmeier, MD  vitamin B-12 (CYANOCOBALAMIN) 1000 MCG tablet Take 1,000 mcg by mouth daily.    Historical Provider, MD  Vitamin D, Ergocalciferol, (DRISDOL) 50000 UNITS CAPS capsule TK ONE C PO Q  WEEK 09/07/14   Historical Provider, MD  zolpidem (AMBIEN) 10 MG tablet TAKE 1 TABLET BY MOUTH AT BEDTIME AS NEEDED FOR SLEEP 09/12/15   Larey Seat, MD    Family History  Family History  Problem Relation Age of Onset  . Congestive Heart Failure Mother   . Pancreatic cancer Father    Family Status  Relation Status  . Mother Deceased   heart disease  . Father Deceased   heart disease, pancreatic cancer  . Brother Deceased   ? MI  . Brother Deceased  . Sister Deceased   sepsis, cardiac transplant  . Child Alive   3, healthy     Social  History  Social History   Social History  . Marital status: Divorced    Spouse name: N/A  . Number of children: 3  . Years of education: MSN   Occupational History  . nurse    Social History Main Topics  . Smoking status: Never Smoker  . Smokeless tobacco: Never Used  . Alcohol use 0.0 oz/week     Comment: 8-12 oz a day  . Drug use: No  . Sexual activity: Not on file   Other Topics Concern  . Not on file   Social History Narrative   Drinks caffeine only when working nights.      All other systems reviewed and are otherwise negative except as noted above.  Physical Exam  Blood pressure 157/96, pulse 82, temperature 97.8 F (36.6 C), temperature source Oral, resp. rate 22, height 5\' 4"  (1.626 m), weight 170 lb (77.1 kg), SpO2 99 %.  General: Pleasant, NAD, obese appears uncomfotable Psych: Normal affect. Neuro: Alert and oriented X 3. Moves all extremities spontaneously. HEENT: Normal  Neck: Supple without bruits or JVD. Lungs:  Resp regular and unlabored, CTA. Heart: RRR no s3, s4, or murmurs. Abdomen: Soft, non-tender, non-distended, BS + x 4.  Extremities: No clubbing, cyanosis or edema. DP/PT/Radials 2+ and equal bilaterally.  Labs  No results for input(s): CKTOTAL, CKMB, TROPONINI in the last 72 hours. Lab Results  Component Value Date   WBC 6.4 12/07/2014   HGB 17.3 (H) 12/07/2014   HCT 50.3 (H) 12/07/2014   MCV 90.1 12/07/2014   PLT 264.0 12/07/2014     Radiology/Studies  No results found.  ECG  ST elevation in V2-V3 lead 1 and aVL,  T-WAVE INVERSIONS IN AVF   ASSESSMENT AND PLAN Shawna Sparks is a 65 y.o. female with a history of uncontrolled IDDM, obesity, HTN, HLD and no prior cardiac history who presented to [facility] today with chest pain and found to have anterolateral STEMI.   Plan: proceed with emergent cardiac catheterization with possible PCI with Dr. Martinique.   Mable Fill, PA-C 09/20/2015, 12:30 PM  Pager  [phone] Patient seen and examined and history reviewed. Agree with above findings and plan. 65 yo WF with uncontrolled DM, HTN, hypercholesterolemia presents with acute chest pain beginning at 9 am. No prior cardiac history. On arrival to ED. Ecg shows ST elevation c/w anterior STEMI. She is having severe chest pain. Given ASA and IV heparin bolus in ED. Will proceed with emergent cardiac cath/PCI. Will need aggressive risk factor modification with insulin, high dose statin, beta blocker +/- ACEi.   Emmilyn Crooke [place], [place] 09/20/2015 2:26 PM

## 2015-09-20 NOTE — ED Notes (Signed)
Family at bedside. 

## 2015-09-20 NOTE — ED Triage Notes (Signed)
The patient said she started having chest pain about an hour and a half ago.  She said she is also having N/V, diaphoresis, chest pain that feels a crushing pain the radiates to the left arm.  She rated the pain 10/10.  EMS gave 324mg  of Aspirin, 1 SL nitro, 6mg  of morphine through a 20g IV in the left forearm.  EMS also gave 4mg  of Zofran.  Her pain did decrease to 4/10.   Her blood sugar was 430 she has not taken her insulin this morning.

## 2015-09-20 NOTE — Progress Notes (Signed)
Paged by nursing staff as patient had recurrent chest pain after cath. Discussed with patient, chest pain resolving after 2 nitroglycerine. EKG TWI in anterior leads however this is likely related to anterior STEMI, given improving symptom, will continue to observe for now.  Hilbert Corrigan PA Pager: [phone]

## 2015-09-20 NOTE — Progress Notes (Signed)
ANTICOAGULATION CONSULT NOTE - Initial Consult  Pharmacy Consult for Heparin Indication: chest pain/ACS  Allergies  Allergen Reactions  . Codeine Shortness Of Breath  . Ambien [Zolpidem Tartrate] Itching  . Benadryl [Diphenhydramine Hcl (Sleep)] Itching  . Dilaudid [Hydromorphone Hcl] Rash  . Tramadol Rash    Patient Measurements: Height: 5\' 4"  (162.6 cm) Weight: 170 lb (77.1 kg) IBW/kg (Calculated) : 54.7 Heparin Dosing Weight: 71 kg  Vital Signs: Temp: 97.8 F (36.6 C) (08/09 1219) Temp Source: Oral (08/09 1219) BP: 157/96 (08/09 1219) Pulse Rate: 82 (08/09 1219)  Labs: No results for input(s): HGB, HCT, PLT, APTT, LABPROT, INR, HEPARINUNFRC, HEPRLOWMOCWT, CREATININE, CKTOTAL, CKMB, TROPONINI in the last 72 hours.  CrCl cannot be calculated (Patient's most recent lab result is older than the maximum 21 days allowed.).   Medical History: Past Medical History:  Diagnosis Date  . Adrenal adenoma right   accidental finding 6 yrs ago-   . Arthritis    knees  . Attention deficit disorder (ADD) t  . Atypical chest pain   . Depression   . Diabetes mellitus    insulin  . High cholesterol   . Insomnia   . Ureteral stone left  . Urinary incontinence     Medications:   (Not in a hospital admission) Scheduled:   Infusions:  . sodium chloride 10 mL/hr (09/20/15 1218)    Assessment: 65yo female presents with CP. Pharmacy is consulted to dose heparin for ACS/chest pain.   Goal of Therapy:  Heparin level 0.3-0.7 units/ml Monitor platelets by anticoagulation protocol: Yes   Plan:  Give 4000 units bolus x 1 Start heparin infusion at 850 units/hr Check anti-Xa level in 6 hours and daily while on heparin Continue to monitor H&H and platelets  Andrey Cota. Diona Foley, PharmD, [facility] Clinical Pharmacist Pager [phone] 09/20/2015,12:22 PM

## 2015-09-20 NOTE — ED Notes (Signed)
Paged [facility] to activate a Code Stemi

## 2015-09-20 NOTE — Progress Notes (Signed)
CRITICAL VALUE ALERT  Critical value received: troponin 57.17  Date of notification: 09/20/15  Time of notification:  2030  Critical value read back:yes  Nurse who received alert:  Purcell Nails  MD notified (1st page):  Vora  Time of first page:  2032  MD notified (2nd page):  Time of second page:  Responding MD:  Clayborne Artist  Time MD responded:  2036

## 2015-09-20 NOTE — Progress Notes (Signed)
TR band removed at 1700 and gauze with tegaderm applied to right radial site. Pt toleratered well. No active bleeding. Rn will continue to monitor.

## 2015-09-20 NOTE — ED Notes (Signed)
MD at bedside. 

## 2015-09-20 NOTE — ED Provider Notes (Signed)
[facility] DEPT Provider Note   CSN: [id number] Arrival date & time: 09/20/15  1201  First Provider Contact:  First MD Initiated Contact with Patient 09/20/15 1202        History   Chief Complaint No chief complaint on file.   HPI Shawna Sparks is a 65 y.o. female.  HPI   65 year old female with history of insulin-dependent diabetes, high cholesterolemia, chest pain presenting for evaluation of chest pain. Patient reports gradual persistent midsternal chest discomfort described as a pressure crushing sensation starting approximately an hour and half ago while she was at home. Her pain radiates to her left arm with associate nausea, vomiting once, lightheadedness and diaphoretic. She endorses mild shortness of breath. She did receive 324 mg of aspirin, 6 mg of morphine, 4 mg of Zofran via EMS prior to arrival. Her pain is currently rated as 7 out of 10. She has never had this kind of pain before. She denies having fever, chills, headache, neck pain, back pain, hemoptysis, productive cough, abdominal pain, back pain, or rash. Does have a strong family history of cardiac disease. Patient is a nonsmoker and denies alcohol abuse. No prior history of heart attack, never had this Pain before. No recent surgery, prolonged bed rest, unilateral leg swelling or calf pain, no prior PE or DVT.  Past Medical History:  Diagnosis Date  . Adrenal adenoma right   accidental finding 6 yrs ago-   . Arthritis    knees  . Attention deficit disorder (ADD) t  . Atypical chest pain   . Depression   . Diabetes mellitus    insulin  . High cholesterol   . Insomnia   . Ureteral stone left  . Urinary incontinence     Patient Active Problem List   Diagnosis Date Noted  . Ureteral calculus, left 12/27/2010  . Ureteral stone 12/21/2010    Past Surgical History:  Procedure Laterality Date  . CARDIAC CATHETERIZATION  2000   wnl  . CHOLECYSTECTOMY  1973  . CYSTOSCOPY/RETROGRADE/URETEROSCOPY   12/27/2010   Procedure: CYSTOSCOPY/RETROGRADE/URETEROSCOPY;  Surgeon: Malka So;  Location: [facility];  Service: Urology;  Laterality: N/A;  cystoscopy left retrograde ureterocopy stone exstraction poss holmium laser  . GASTRIC BYPASS  2004  . LUMBAR DISC SURGERY    . LUMBAR FUSION  1997   L 4 - 5  . LUMBAR MICRODISCECTOMY  1996   L4 - 5  . VAGINAL HYSTERECTOMY  1996    OB History    No data available       Home Medications    Prior to Admission medications   Medication Sig Start Date End Date Taking? Authorizing Provider  cyclobenzaprine (FLEXERIL) 10 MG tablet TK 1 T PO BID PRN 04/17/15   Historical Provider, MD  escitalopram (LEXAPRO) 20 MG tablet TK 1 T PO QD 11/11/14   Historical Provider, MD  fluconazole (DIFLUCAN) 150 MG tablet TK 1 T PO QD 11/11/14   Historical Provider, MD  lisdexamfetamine (VYVANSE) 60 MG capsule Take 1 capsule (60 mg total) by mouth every morning. 05/11/15   Carmen Dohmeier, MD  NOVOLOG 100 UNIT/ML injection TK BEFORE EACH MEAL BY SLIDING SCALE 11/11/14   Historical Provider, MD  traZODone (DESYREL) 100 MG tablet Take 1 tablet (100 mg total) by mouth at bedtime. 05/11/15   Asencion Partridge Dohmeier, MD  vitamin B-12 (CYANOCOBALAMIN) 1000 MCG tablet Take 1,000 mcg by mouth daily.    Historical Provider, MD  Vitamin D, Ergocalciferol, (DRISDOL) 50000 UNITS  CAPS capsule TK ONE C PO Q WEEK 09/07/14   Historical Provider, MD  zolpidem (AMBIEN) 10 MG tablet TAKE 1 TABLET BY MOUTH AT BEDTIME AS NEEDED FOR SLEEP 09/12/15   Larey Seat, MD    Family History Family History  Problem Relation Age of Onset  . Congestive Heart Failure Mother   . Pancreatic cancer Father     Social History Social History  Substance Use Topics  . Smoking status: Never Smoker  . Smokeless tobacco: Never Used  . Alcohol use 0.0 oz/week     Comment: 8-12 oz a day     Allergies   Codeine; Ambien [zolpidem tartrate]; Benadryl [diphenhydramine hcl (sleep)]; Dilaudid  [hydromorphone hcl]; and Tramadol   Review of Systems Review of Systems  All other systems reviewed and are negative.    Physical Exam Updated Vital Signs There were no vitals taken for this visit.  Physical Exam  Constitutional: She appears well-developed and well-nourished. No distress (Elderly Caucasian female appears uncomfortable and diaphoretic.).  HENT:  Head: Atraumatic.  Eyes: Conjunctivae are normal.  Neck: Neck supple. No JVD present.  Cardiovascular: Normal rate, regular rhythm and intact distal pulses.   Pulmonary/Chest:  Mild tachypnea without tachycardia  Abdominal: Soft. Bowel sounds are normal. There is no tenderness.  Musculoskeletal: She exhibits no edema.  Neurological: She is alert.  Skin: Capillary refill takes less than 2 seconds. No rash noted.  Psychiatric: She has a normal mood and affect.  Nursing note and vitals reviewed.    ED Treatments / Results  Labs (all labs ordered are listed, but only abnormal results are displayed) Labs Reviewed  CBC  DIFFERENTIAL  PROTIME-INR  APTT  COMPREHENSIVE METABOLIC PANEL  TROPONIN I  LIPID PANEL  I-STAT CHEM 8, ED  I-STAT TROPOININ, ED    EKG  EKG Interpretation  Date/Time:  Wednesday September 20 2015 12:08:10 EDT Ventricular Rate:  80 PR Interval:    QRS Duration: 84 QT Interval:  429 QTC Calculation: 495 R Axis:   27 Text Interpretation:  Sinus rhythm Anterior infarct, possibly acute Lateral leads are also involved Confirmed by KNOTT MD, DANIEL [id number]) on 09/20/2015 12:17:07 PM       Radiology No results found.  Procedures Procedures (including critical care time)  Medications Ordered in ED Medications  0.9 %  sodium chloride infusion (10 mL/hr Intravenous New Bag/Given 09/20/15 1218)  nitroGLYCERIN (NITROSTAT) SL tablet 0.4 mg (not administered)  nitroGLYCERIN 50 mg in dextrose 5 % 250 mL (0.2 mg/mL) infusion (not administered)  heparin ADULT infusion 100 units/mL (25000 units/239mL  sodium chloride 0.45%) (not administered)  heparin injection 4,000 Units (4,000 Units Intravenous Given 09/20/15 1222)     Initial Impression / Assessment and Plan / ED Course  I have reviewed the triage vital signs and the nursing notes.  Pertinent labs & imaging results that were available during my care of the patient were reviewed by me and considered in my medical decision making (see chart for details).  Clinical Course    BP 157/96 (BP Location: Right Arm)   Pulse 82   Temp 97.8 F (36.6 C) (Oral)   Resp 22   Ht 5\' 4"  (1.626 m)   Wt 77.1 kg   SpO2 99%   BMI 29.18 kg/m    Final Clinical Impressions(s) / ED Diagnoses   Final diagnoses:  Ischemic chest pain (HCC)    New Prescriptions New Prescriptions   No medications on file   12:23 PM Patient presents with central chest  pain concerning for ACS. EKG shows changes in the anterior leads concerning for anterior infarct as well as potential lateral infarct. The pain is improved from 10 out of 10 to 7 out of 10 after patient received sublingual nitroglycerin, morphine and Zofran. Code STEMI activated.  Will place pt on nitro drip, heparin and will consult cardiology for further care.    12:31 PM Cardiology will take pt to Cath Lab.  Care discussed with Dr. Laneta Simmers  CRITICAL CARE Performed by: Domenic Moras Total critical care time: 30 minutes Critical care time was exclusive of separately billable procedures and treating other patients. Critical care was necessary to treat or prevent imminent or life-threatening deterioration. Critical care was time spent personally by me on the following activities: development of treatment plan with patient and/or surrogate as well as nursing, discussions with consultants, evaluation of patient's response to treatment, examination of patient, obtaining history from patient or surrogate, ordering and performing treatments and interventions, ordering and review of laboratory studies, ordering  and review of radiographic studies, pulse oximetry and re-evaluation of patient's condition.    Domenic Moras, PA-C 09/20/15 1232    Leo Grosser, MD 09/20/15 2131

## 2015-09-20 NOTE — ED Provider Notes (Signed)
Medical screening examination/treatment/procedure(s) were conducted as a shared visit with non-physician practitioner(s) and myself.  I personally evaluated the patient during the encounter.   EKG Interpretation  Date/Time:  Wednesday September 20 2015 12:08:10 EDT Ventricular Rate:  80 PR Interval:    QRS Duration: 84 QT Interval:  429 QTC Calculation: 495 R Axis:   27 Text Interpretation:  Sinus rhythm Anterior infarct, possibly acute Lateral leads are also involved Confirmed by Fadi Menter MD, Suhaas Agena [phone]) on 09/20/2015 12:17:07 PM      65 y.o. female presents with crushing substernal chest pain radiating to left arm. Morphology of EKG is highly concerning for evolving AMI given clinically ill appearance. Code STEMI activated pending further workup to initiate discussion with cardiology.   Pt given ASA, heparin bolus, SL nitro. Vital signs monitored, relayed information to cardiology nurse who came to ED to evaluate Pt. Patient rushed emergently to cath lab for PCI.   See related encounter note  CRITICAL CARE Performed by: Leo Grosser Total critical care time: 30 minutes Critical care time was exclusive of separately billable procedures and treating other patients. Critical care was necessary to treat or prevent imminent or life-threatening deterioration. Critical care was time spent personally by me on the following activities: development of treatment plan with patient and/or surrogate as well as nursing, discussions with consultants, evaluation of patient's response to treatment, examination of patient, obtaining history from patient or surrogate, ordering and performing treatments and interventions, ordering and review of laboratory studies, ordering and review of radiographic studies, pulse oximetry and re-evaluation of patient's condition.    Leo Grosser, MD 09/20/15 2131

## 2015-09-20 NOTE — ED Notes (Signed)
The patient was taken to the cath lab.  Stemi nurse at the bedside.  Report given to cathlab nurse.

## 2015-09-21 ENCOUNTER — Inpatient Hospital Stay (HOSPITAL_COMMUNITY): Payer: 59

## 2015-09-21 DIAGNOSIS — I10 Essential (primary) hypertension: Secondary | ICD-10-CM

## 2015-09-21 DIAGNOSIS — I255 Ischemic cardiomyopathy: Secondary | ICD-10-CM

## 2015-09-21 DIAGNOSIS — E785 Hyperlipidemia, unspecified: Secondary | ICD-10-CM

## 2015-09-21 DIAGNOSIS — I213 ST elevation (STEMI) myocardial infarction of unspecified site: Secondary | ICD-10-CM

## 2015-09-21 DIAGNOSIS — E1159 Type 2 diabetes mellitus with other circulatory complications: Secondary | ICD-10-CM

## 2015-09-21 DIAGNOSIS — Z794 Long term (current) use of insulin: Secondary | ICD-10-CM

## 2015-09-21 DIAGNOSIS — E1165 Type 2 diabetes mellitus with hyperglycemia: Secondary | ICD-10-CM

## 2015-09-21 LAB — LIPID PANEL
CHOL/HDL RATIO: 5.6 ratio
CHOLESTEROL: 235 mg/dL — AB (ref 0–200)
HDL: 42 mg/dL (ref >40)
LDL Cholesterol: 149 mg/dL — ABNORMAL HIGH (ref 0–99)
Triglycerides: 221 mg/dL — ABNORMAL HIGH (ref <150)
VLDL: 44 mg/dL — ABNORMAL HIGH (ref 0–40)

## 2015-09-21 LAB — GLUCOSE, CAPILLARY
GLUCOSE-CAPILLARY: 235 mg/dL — AB (ref 65–99)
GLUCOSE-CAPILLARY: 268 mg/dL — AB (ref 65–99)
GLUCOSE-CAPILLARY: 256 mg/dL — AB (ref 65–99)

## 2015-09-21 LAB — CBC
HEMATOCRIT: 43.7 % (ref 36.0–46.0)
Hemoglobin: 14.7 g/dL (ref 12.0–15.0)
MCH: 29.6 pg (ref 26.0–34.0)
MCHC: 33.6 g/dL (ref 30.0–36.0)
MCV: 87.9 fL (ref 78.0–100.0)
Platelets: 200 10*3/uL (ref 150–400)
RBC: 4.97 MIL/uL (ref 3.87–5.11)
RDW: 12.4 % (ref 11.5–15.5)
WBC: 7.4 10*3/uL (ref 4.0–10.5)

## 2015-09-21 LAB — COMPREHENSIVE METABOLIC PANEL
ALK PHOS: 54 U/L (ref 38–126)
ALT: 39 U/L (ref 14–54)
ANION GAP: 9 (ref 5–15)
AST: 156 U/L — ABNORMAL HIGH (ref 15–41)
Albumin: 3.7 g/dL (ref 3.5–5.0)
BILIRUBIN TOTAL: 0.9 mg/dL (ref 0.3–1.2)
BUN: 11 mg/dL (ref 6–20)
CALCIUM: 9 mg/dL (ref 8.9–10.3)
CO2: 22 mmol/L (ref 22–32)
Chloride: 102 mmol/L (ref 101–111)
Creatinine, Ser: 0.49 mg/dL (ref 0.44–1.00)
GFR calc Af Amer: >60 mL/min (ref >60)
Glucose, Bld: 303 mg/dL — ABNORMAL HIGH (ref 65–99)
POTASSIUM: 3.6 mmol/L (ref 3.5–5.1)
Sodium: 133 mmol/L — ABNORMAL LOW (ref 135–145)
TOTAL PROTEIN: 5.8 g/dL — AB (ref 6.5–8.1)

## 2015-09-21 LAB — ECHOCARDIOGRAM COMPLETE
Height: 64 in
Weight: 2740.76 oz

## 2015-09-21 LAB — HEMOGLOBIN A1C
HEMOGLOBIN A1C: 11.7 % — AB (ref 4.8–5.6)
MEAN PLASMA GLUCOSE: 289 mg/dL

## 2015-09-21 LAB — HEPARIN LEVEL (UNFRACTIONATED): Heparin Unfractionated: 0.6 IU/mL (ref 0.30–0.70)

## 2015-09-21 LAB — PROTIME-INR
INR: 0.97
Prothrombin Time: 12.8 seconds (ref 11.4–15.2)

## 2015-09-21 LAB — TROPONIN I: Troponin I: 39.33 ng/mL (ref <0.03)

## 2015-09-21 MED ORDER — PERFLUTREN LIPID MICROSPHERE
INTRAVENOUS | Status: AC
  Filled 2015-09-21: qty 10

## 2015-09-21 MED ORDER — HEPARIN (PORCINE) IN NACL 100-0.45 UNIT/ML-% IJ SOLN
1300.0000 [IU]/h | INTRAMUSCULAR | Status: DC
  Administered 2015-09-21 – 2015-09-22 (×2): 1000 [IU]/h via INTRAVENOUS
  Administered 2015-09-23: 1150 [IU]/h via INTRAVENOUS
  Filled 2015-09-21 (×3): qty 250

## 2015-09-21 MED ORDER — PERFLUTREN LIPID MICROSPHERE
1.0000 mL | INTRAVENOUS | Status: AC | PRN
  Administered 2015-09-21: 3 mL via INTRAVENOUS
  Filled 2015-09-21: qty 10

## 2015-09-21 MED ORDER — HEPARIN BOLUS VIA INFUSION
3500.0000 [IU] | Freq: Once | INTRAVENOUS | Status: AC
  Administered 2015-09-21: 3500 [IU] via INTRAVENOUS
  Filled 2015-09-21: qty 3500

## 2015-09-21 MED ORDER — INSULIN GLARGINE 100 UNIT/ML ~~LOC~~ SOLN
20.0000 [IU] | Freq: Every day | SUBCUTANEOUS | Status: DC
  Administered 2015-09-22 – 2015-09-24 (×3): 20 [IU] via SUBCUTANEOUS
  Filled 2015-09-21 (×3): qty 0.2

## 2015-09-21 MED ORDER — WARFARIN SODIUM 5 MG PO TABS
5.0000 mg | ORAL_TABLET | Freq: Once | ORAL | Status: AC
  Administered 2015-09-21: 5 mg via ORAL
  Filled 2015-09-21: qty 1

## 2015-09-21 MED ORDER — WARFARIN - PHARMACIST DOSING INPATIENT: Freq: Every day | Status: DC

## 2015-09-21 NOTE — Care Management Note (Signed)
Case Management Note  Patient Details  Name: Shawna Sparks MRN: [id number] Date of Birth: Nov 19, 1950  Subjective/Objective:  65 y.o. F admitted 09/20/2015 with CP. Determined to have had Anterior MI.    Independent from Private residence pta.                Action/Plan:CM will follow closely for disposition/discharge needs.    Expected Discharge Date:                  Expected Discharge Plan:  Home/Self Care  In-House Referral:     Discharge planning Services  CM Consult  Post Acute Care Choice:    Choice offered to:     DME Arranged:    DME Agency:     HH Arranged:    HH Agency:     Status of Service:  In process, will continue to follow  If discussed at Long Length of Stay Meetings, dates discussed:    Additional Comments:  Delrae Sawyers, RN 09/21/2015, 11:12 AM

## 2015-09-21 NOTE — Progress Notes (Addendum)
SUBJECTIVE:  No further CP after last night.  OBJECTIVE:   Vitals:   Vitals:   09/21/15 0500 09/21/15 0600 09/21/15 0700 09/21/15 0800  BP: (!) 145/78 (!) 145/82 (!) 137/99 122/72  Pulse: 73 67 70 72  Resp: 20 20 16 13   Temp:    98.2 F (36.8 C)  TempSrc:    Oral  SpO2: 95% 95% 96% 97%  Weight:      Height:       I&O's:   Intake/Output Summary (Last 24 hours) at 09/21/15 0956 Last data filed at 09/21/15 [id number]  Gross per 24 hour  Intake             1290 ml  Output                0 ml  Net             1290 ml   TELEMETRY: Reviewed telemetry pt in NSR:     PHYSICAL EXAM General: Well developed, well nourished, in no acute distress Head:   Normal cephalic and atramatic  Lungs:  Clear bilaterally to auscultation. Heart:   HRRR S1 S2  No JVD.   Abdomen: abdomen soft and non-tender Msk:  Back normal,  Normal strength and tone for age. Extremities:   No edema.  2+ right radial pulse Neuro: Alert and oriented. Psych:  Normal affect, responds appropriately Skin: No rash   LABS: Basic Metabolic Panel:  Recent Labs  09/20/15 1221 09/20/15 1231 09/20/15 1417 09/21/15 0016  NA 135 135  --  133*  K 3.6 3.6  --  3.6  CL 97* 96*  --  102  CO2 23  --   --  22  GLUCOSE 396* 402*  --  303*  BUN 14 15  --  11  CREATININE 0.71 0.50 0.64 0.49  CALCIUM 9.9  --   --  9.0   Liver Function Tests:  Recent Labs  09/20/15 1221 09/21/15 0016  AST 18 156*  ALT 15 39  ALKPHOS 59 54  BILITOT 0.9 0.9  PROT 6.7 5.8*  ALBUMIN 4.1 3.7   No results for input(s): LIPASE, AMYLASE in the last 72 hours. CBC:  Recent Labs  09/20/15 1221  09/20/15 1417 09/21/15 0016  WBC 8.6  --  9.3 7.4  NEUTROABS 6.6  --   --   --   HGB 15.4*  < > 15.8* 14.7  HCT 45.2  < > 46.1* 43.7  MCV 87.4  --  88.1 87.9  PLT 236  --  202 200  < > = values in this interval not displayed. Cardiac Enzymes:  Recent Labs  09/20/15 1221 09/20/15 1920 09/21/15 0016  TROPONINI 0.08* 57.17* 39.33*     BNP: Invalid input(s): POCBNP D-Dimer: No results for input(s): DDIMER in the last 72 hours. Hemoglobin A1C:  Recent Labs  09/20/15 1818  HGBA1C 11.7*   Fasting Lipid Panel:  Recent Labs  09/21/15 0016  CHOL 235*  HDL 42  LDLCALC 149*  TRIG 221*  CHOLHDL 5.6   Thyroid Function Tests:  Recent Labs  09/20/15 1830  TSH 2.613   Anemia Panel: No results for input(s): VITAMINB12, FOLATE, FERRITIN, TIBC, IRON, RETICCTPCT in the last 72 hours. Coag Panel:   Lab Results  Component Value Date   INR 0.97 09/21/2015   INR 0.98 09/20/2015   INR 0.95 09/20/2015    RADIOLOGY: No results found.    ASSESSMENT: Kathyrn Lass:    1) CAD: s/p  anterior MI with anterior and apical wall motion abnormality.  Possible apical thrombus by echo which I personally reviewed.  Images will be obtained with contrast to confirm thrombus.  Would have to add Coumadin if thrombus is present.  Would not use NOAC with Brilinta.  Start IV heparin if thrombus is confirmed.  2) Hyperlipidemia: LDL elevated. COntiue high dose statin.   3) LV dysfunction: On Coreg.  Add low dose lisinopril if BP will tolerate.  WIll wait one more day to see about adding ACE.  I anticipate she will be here until at least Saturday.  I reviewed her cath films.  SHe has a diffusely diseased LAD with residual disease past the stent in the smaller portion of the vessel.    DM: SSI.  Significant weight loss after prior weight loss surgery.  Jettie Booze, MD  09/21/2015  9:56 AM

## 2015-09-21 NOTE — Progress Notes (Signed)
ANTICOAGULATION CONSULT NOTE - Follow-up Consult  Pharmacy Consult for heparin Indication: possible apical thrombus  Patient Measurements: Height: 5\' 4"  (162.6 cm) Weight: 171 lb 4.8 oz (77.7 kg) IBW/kg (Calculated) : 54.7 Heparin Dosing Weight: 71.2  Vital Signs: Temp: 98 F (36.7 C) (08/10 2000) Temp Source: Oral (08/10 2000) BP: 123/93 (08/10 2000) Pulse Rate: 80 (08/10 2000)  Labs:  Recent Labs  09/20/15 1221 09/20/15 1231 09/20/15 1417 09/20/15 1818 09/20/15 1920 09/21/15 0016 09/21/15 2135  HGB 15.4* 15.6* 15.8*  --   --  14.7  --   HCT 45.2 46.0 46.1*  --   --  43.7  --   PLT 236  --  202  --   --  200  --   APTT 24  --   --   --   --   --   --   LABPROT 12.7  --   --  13.0  --  12.8  --   INR 0.95  --   --  0.98  --  0.97  --   HEPARINUNFRC  --   --   --   --   --   --  0.60  CREATININE 0.71 0.50 0.64  --   --  0.49  --   TROPONINI 0.08*  --   --   --  57.17* 39.33*  --     Estimated Creatinine Clearance: 70.7 mL/min (by C-G formula based on SCr of 0.8 mg/dL).  Assessment: 65 yo female admitted for STEMI, on ECHO could not r/o possible apical thrombus. Pharmacy consulted for heparin bridge to warfarin. Of note, patient is also on ASA and brilinta. Heparin level therapeutic (0.6) on 1000 units/hr. No bleeding noted.  Goal of Therapy:  INR 2-3 Heparin level 0.3-0.7 units/ml Monitor platelets by anticoagulation protocol: Yes   Plan:  Continue heparin at 1000 units/hr F/u daily heparin level, CBC, and INR  Sherlon Handing, PharmD, BCPS Clinical pharmacist, pager [phone] 09/21/15 11:38 PM

## 2015-09-21 NOTE — Progress Notes (Signed)
ANTICOAGULATION CONSULT NOTE - Initial Consult  Pharmacy Consult for heparin and warfarin Indication: possible apical thrombus  Patient Measurements: Height: 5\' 4"  (162.6 cm) Weight: 171 lb 4.8 oz (77.7 kg) IBW/kg (Calculated) : 54.7 Heparin Dosing Weight: 71.2  Vital Signs: Temp: 98.4 F (36.9 C) (08/10 1200) Temp Source: Oral (08/10 1200) BP: 118/72 (08/10 1400) Pulse Rate: 69 (08/10 1400)  Labs:  Recent Labs  09/20/15 1221 09/20/15 1231 09/20/15 1417 09/20/15 1818 09/20/15 1920 09/21/15 0016  HGB 15.4* 15.6* 15.8*  --   --  14.7  HCT 45.2 46.0 46.1*  --   --  43.7  PLT 236  --  202  --   --  200  APTT 24  --   --   --   --   --   LABPROT 12.7  --   --  13.0  --  12.8  INR 0.95  --   --  0.98  --  0.97  CREATININE 0.71 0.50 0.64  --   --  0.49  TROPONINI 0.08*  --   --   --  57.17* 39.33*    Estimated Creatinine Clearance: 70.7 mL/min (by C-G formula based on SCr of 0.8 mg/dL).  Assessment: 65 yo female admitted for STEMI, on ECHO could not r/o possible apical thrombus. Pharmacy consulted for heparin bridge to warfarin. Of note, patient is also on ASA and brilinta. Was on Lovenox for DVT px so will discontinue with start of heparin and monitor closely for any signs or symptoms of bleeding. Hgb stable 14.7, PLT 200. No signs of bleeding noted. Baseline INR 0.97.  Goal of Therapy:  INR 2-3 Heparin level 0.3-0.7 units/ml Monitor platelets by anticoagulation protocol: Yes   Plan:  Give 3500 units bolus x 1 Start heparin infusion at 1000 units/hr Check anti-Xa level in 6 hours and daily while on heparin Continue to monitor H&H and platelets   Warfarin 5mg *1 tonight Daily INR/CBC   Melburn Popper, PharmD Clinical Pharmacy Resident Pager: [phone] 09/21/15 3:44 PM

## 2015-09-21 NOTE — Progress Notes (Signed)
Inpatient Diabetes Program Recommendations  AACE/ADA: New Consensus Statement on Inpatient Glycemic Control (2015)  Target Ranges:  Prepandial:   less than 140 mg/dL      Peak postprandial:   less than 180 mg/dL (1-2 hours)      Critically ill patients:  140 - 180 mg/dL   Results for Shawna Sparks, Shawna Sparks (MRN [id number]) as of 09/21/2015 13:19  Ref. Range 09/20/2015 17:32 09/20/2015 21:42 09/21/2015 08:14 09/21/2015 11:46  Glucose-Capillary Latest Ref Range: 65 - 99 mg/dL 283 (H) 297 (H) 235 (H) 268 (H)   Results for Shawna Sparks, Shawna Sparks (MRN [id number]) as of 09/21/2015 13:19  Ref. Range 09/20/2015 18:18  Hemoglobin A1C Latest Ref Range: 4.8 - 5.6 % 11.7 (H)   Review of Glycemic Control  Diabetes history: DM2 Outpatient Diabetes medications: Lantus 20-30 units hs+Novolog meal coverage 20 units tid Current orders for Inpatient glycemic control: Lantus 10 units daily + Novolog correction 0-15 units tid with meals  Inpatient Diabetes Program Recommendations:  Please consider increase in Lantus insulin to 15 units daily and add meal coverage 5 units tid if eats>50% and add Novolog correction 0-5 units hs. Will plan to speak to patient concerning elevated A1c of 11.7 and importance of medication compliance.  Thank you, Nani Gasser. Owen Pagnotta, RN, MSN, CDE Inpatient Glycemic Control Team Team Pager [phone] (8am-5pm) 09/21/2015 1:22 PM

## 2015-09-21 NOTE — Progress Notes (Signed)
[id number] Education started with pt. Discussed MI restrictions, stent and purpose of brilinta,risk factors, NTG use and purpose of statin. Pt voiced understanding. Discussed CRP 2 and will refer to [facility]. Pt has A1C of 11.7 so discussed carb counting. Pt stated she has been in denial and not taking insulin or checking sugars as she should be doing. Discussed importance of keeping A1C more favorable and reviewed carb counting. Will follow up tomorrow for ambulation. Graylon Good RN BSN 09/21/2015 3:15 PM

## 2015-09-21 NOTE — Progress Notes (Signed)
*  PRELIMINARY RESULTS* Echocardiogram 2D Echocardiogram has been performed.  Leavy Cella 09/21/2015, 10:31 AM

## 2015-09-21 NOTE — Progress Notes (Signed)
Nutrition Brief Note  Patient identified on the Malnutrition Screening Tool (MST) Report  Pt has a hx of gastric bypass in 2004 at which time she was 300 lb. She has been down to 175 lb for some time now. She feels that she has had more recent weight loss related to uncontrolled DM. She admits that she has not been checking her blood sugar or taking her insulin due to denial of the disease. She is a Marine scientist at [employer] and has good understanding of her diet and disease and is now willing to take better care of herself. Pt declined any diet education, I did encourage her to attend outpatient education after d/c. Pt will have cardiac rehab after d/c and be able to see a RD at that time. We discussed how high blood sugars can cause her to lose weight and pt feels that she is on the right track now. No further interventions at this time.  Encouragement provided.   Lab Results  Component Value Date   HGBA1C 11.7 (H) 09/20/2015    Wt Readings from Last 15 Encounters:  09/20/15 171 lb 4.8 oz (77.7 kg)  05/11/15 174 lb (78.9 kg)  11/29/14 175 lb (79.4 kg)  12/26/10 195 lb (88.5 kg)  12/20/10 198 lb (89.8 kg)    Body mass index is 29.4 kg/m. Patient meets criteria for overweight based on current BMI.   Current diet order is Heart Healthy, patient is consuming approximately 75% of meals at this time. Labs and medications reviewed.   [place], [place], [place] Pager [phone] After Hours Pager

## 2015-09-22 DIAGNOSIS — R931 Abnormal findings on diagnostic imaging of heart and coronary circulation: Secondary | ICD-10-CM

## 2015-09-22 LAB — GLUCOSE, CAPILLARY
GLUCOSE-CAPILLARY: 378 mg/dL — AB (ref 65–99)
GLUCOSE-CAPILLARY: 246 mg/dL — AB (ref 65–99)
Glucose-Capillary: 239 mg/dL — ABNORMAL HIGH (ref 65–99)
Glucose-Capillary: 193 mg/dL — ABNORMAL HIGH (ref 65–99)

## 2015-09-22 LAB — HEPARIN LEVEL (UNFRACTIONATED): Heparin Unfractionated: 0.44 IU/mL (ref 0.30–0.70)

## 2015-09-22 LAB — PROTIME-INR
INR: 1.02
PROTHROMBIN TIME: 13.4 s (ref 11.4–15.2)

## 2015-09-22 MED ORDER — CITALOPRAM HYDROBROMIDE 20 MG PO TABS
20.0000 mg | ORAL_TABLET | Freq: Every day | ORAL | Status: DC
  Administered 2015-09-23 – 2015-09-24 (×2): 20 mg via ORAL
  Filled 2015-09-22 (×2): qty 1

## 2015-09-22 MED ORDER — ZOLPIDEM TARTRATE 5 MG PO TABS
10.0000 mg | ORAL_TABLET | Freq: Every evening | ORAL | Status: DC | PRN
  Administered 2015-09-22 – 2015-09-23 (×2): 10 mg via ORAL
  Filled 2015-09-22 (×2): qty 2

## 2015-09-22 MED ORDER — WARFARIN SODIUM 5 MG PO TABS
5.0000 mg | ORAL_TABLET | Freq: Once | ORAL | Status: AC
  Administered 2015-09-22: 5 mg via ORAL
  Filled 2015-09-22: qty 1

## 2015-09-22 MED ORDER — LIVING WELL WITH DIABETES BOOK
Freq: Once | Status: DC
  Filled 2015-09-22: qty 1

## 2015-09-22 NOTE — Progress Notes (Signed)
Inpatient Diabetes Program Recommendations  AACE/ADA: New Consensus Statement on Inpatient Glycemic Control (2015)  Target Ranges:  Prepandial:   less than 140 mg/dL      Peak postprandial:   less than 180 mg/dL (1-2 hours)      Critically ill patients:  140 - 180 mg/dL   Results for JARRICA, RAKERS (MRN [id number]) as of 09/22/2015 12:17  Ref. Range 09/20/2015 18:18  Hemoglobin A1C Latest Ref Range: 4.8 - 5.6 % 11.7 (H)    Inpatient Diabetes Program Recommendations:   Spoke with pt  Regarding A1C results with them and explained what an A1C is, basic home care, basic diabetes diet nutrition principles, importance of checking CBGs and maintaining good CBG control to prevent long-term and short-term complications. Reviewed signs and symptoms of hyperglycemia and hypoglycemia and how to treat hypoglycemia at home. Also reviewed blood sugar goals at home.  RNs to provide ongoing basic DM education at bedside with this patient. Have ordered educational booklet and DM videos.  Daughter @ bedside and interested in learning more about DM and reviewed videos available. Patient acknowledges understanding of needing to keep CBGs within normal range.  Thank you, Nani Gasser. Brecken Dewoody, RN, MSN, CDE Inpatient Glycemic Control Team Team Pager [phone] (8am-5pm) 09/22/2015 12:20 PM

## 2015-09-22 NOTE — Progress Notes (Signed)
SUBJECTIVE:  No chest pain.  OBJECTIVE:   Vitals:   Vitals:   09/21/15 2000 09/22/15 0120 09/22/15 0655 09/22/15 0850  BP: (!) 123/93 (!) 93/55 106/65 99/61  Pulse: 80 86 80 80  Resp: 16     Temp: 98 F (36.7 C)     TempSrc: Oral     SpO2: 97% 98% 97% 95%  Weight:      Height:       I&O's:   Intake/Output Summary (Last 24 hours) at 09/22/15 [id number] Last data filed at 09/22/15 0900  Gross per 24 hour  Intake           141.17 ml  Output                0 ml  Net           141.17 ml   TELEMETRY: Reviewed telemetry pt in NSR, PVCs:     PHYSICAL EXAM General: Well developed, well nourished, in no acute distress Head:   Normal cephalic and atramatic  Lungs:   Clear bilaterally to auscultation. Heart:   HRRR S1 S2  No JVD.   Abdomen: abdomen soft and non-tender Msk:  Back normal,  Normal strength and tone for age. Extremities:   No edema.  2+ radial pulse Neuro: Alert and oriented. Psych:  Normal affect, responds appropriately Skin: No rash   LABS: Basic Metabolic Panel:  Recent Labs  09/20/15 1221 09/20/15 1231 09/20/15 1417 09/21/15 0016  NA 135 135  --  133*  K 3.6 3.6  --  3.6  CL 97* 96*  --  102  CO2 23  --   --  22  GLUCOSE 396* 402*  --  303*  BUN 14 15  --  11  CREATININE 0.71 0.50 0.64 0.49  CALCIUM 9.9  --   --  9.0   Liver Function Tests:  Recent Labs  09/20/15 1221 09/21/15 0016  AST 18 156*  ALT 15 39  ALKPHOS 59 54  BILITOT 0.9 0.9  PROT 6.7 5.8*  ALBUMIN 4.1 3.7   No results for input(s): LIPASE, AMYLASE in the last 72 hours. CBC:  Recent Labs  09/20/15 1221  09/20/15 1417 09/21/15 0016  WBC 8.6  --  9.3 7.4  NEUTROABS 6.6  --   --   --   HGB 15.4*  < > 15.8* 14.7  HCT 45.2  < > 46.1* 43.7  MCV 87.4  --  88.1 87.9  PLT 236  --  202 200  < > = values in this interval not displayed. Cardiac Enzymes:  Recent Labs  09/20/15 1221 09/20/15 1920 09/21/15 0016  TROPONINI 0.08* 57.17* 39.33*   BNP: Invalid input(s):  POCBNP D-Dimer: No results for input(s): DDIMER in the last 72 hours. Hemoglobin A1C:  Recent Labs  09/20/15 1818  HGBA1C 11.7*   Fasting Lipid Panel:  Recent Labs  09/21/15 0016  CHOL 235*  HDL 42  LDLCALC 149*  TRIG 221*  CHOLHDL 5.6   Thyroid Function Tests:  Recent Labs  09/20/15 1830  TSH 2.613   Anemia Panel: No results for input(s): VITAMINB12, FOLATE, FERRITIN, TIBC, IRON, RETICCTPCT in the last 72 hours. Coag Panel:   Lab Results  Component Value Date   INR 1.02 09/22/2015   INR 0.97 09/21/2015   INR 0.98 09/20/2015    RADIOLOGY: No results found.    ASSESSMENT: Kathyrn Lass:   1) Acute anterior wall MI.  Doing well. Echo showed apical hypokinesis  and could not rule out apical thrombus.  Thrombus was not definitively sen but I spoke with Dr. Oval Linsey who felt that she was high risk for developing thrombus.  Heparin and COumadin started.  Stop aspirin 2 weeks after the MI.  COntinue COumadin and Brilnta.  She wants to go home now and I don't think she will stay until INR is >2.  Given that this is more preventive, consider sending out with early INR f/u next week in the office.   Atorvastatin for mixed hyperlipidemia.   Insulin for DM.  Poorly controlled at home based on HbA1C.   Decrease EF by echo.  Appears euvolemic.   Jettie Booze, MD  09/22/2015  9:23 AM

## 2015-09-22 NOTE — Care Management Note (Addendum)
Case Management Note  Patient Details  Name: Shawna Sparks MRN: [id number] Date of Birth: 08-22-1950  Subjective/Objective:          Adm w mi          Action/Plan: lives at home, pt is nse   Expected Discharge Date:                  Expected Discharge Plan:  Home/Self Care  In-House Referral:     Discharge planning Services  CM Consult, Medication Assistance  Post Acute Care Choice:    Choice offered to:     DME Arranged:    DME Agency:     HH Arranged:    [place] Agency:     Status of Service:  Completed, signed off  If discussed at [employer], dates discussed:    Additional Comments: gave pt 30day free and copay card for brilinta.S/W NAJELA @ [facility] # [phone] OPT- 6 , 3   BRILINTA 90 MG BID (30 )   COVER- YES  CO-PAY- ZERO DOLLARS  TIER- 2 DRUG  PRIOR APPROVAL - NO  PHARMACY : Suzie Portela , Shawnie Pons, RN 09/22/2015, 10:51 AM

## 2015-09-22 NOTE — Progress Notes (Signed)
ANTICOAGULATION CONSULT NOTE - Follow-up Consult  Pharmacy Consult for heparin Indication: possible apical thrombus  Patient Measurements: Height: 5\' 4"  (162.6 cm) Weight: 171 lb 4.8 oz (77.7 kg) IBW/kg (Calculated) : 54.7 Heparin Dosing Weight: 71.2  Vital Signs: Temp: 98 F (36.7 C) (08/10 2000) Temp Source: Oral (08/10 2000) BP: 106/65 (08/11 0655) Pulse Rate: 80 (08/11 0655)  Labs:  Recent Labs  09/20/15 1221 09/20/15 1231 09/20/15 1417 09/20/15 1818 09/20/15 1920 09/21/15 0016 09/21/15 2135 09/22/15 0325  HGB 15.4* 15.6* 15.8*  --   --  14.7  --   --   HCT 45.2 46.0 46.1*  --   --  43.7  --   --   PLT 236  --  202  --   --  200  --   --   APTT 24  --   --   --   --   --   --   --   LABPROT 12.7  --   --  13.0  --  12.8  --  13.4  INR 0.95  --   --  0.98  --  0.97  --  1.02  HEPARINUNFRC  --   --   --   --   --   --  0.60 0.44  CREATININE 0.71 0.50 0.64  --   --  0.49  --   --   TROPONINI 0.08*  --   --   --  57.17* 39.33*  --   --     Estimated Creatinine Clearance: 70.7 mL/min (by C-G formula based on SCr of 0.8 mg/dL).  Assessment: 65 yo female admitted for STEMI, on ECHO could not r/o possible apical thrombus. Pharmacy consulted for heparin bridge to warfarin. Of note, patient is also on ASA and brilinta. Heparin level therapeutic (0.44) on 1000 units/hr. INR 1.02. No bleeding noted.  Goal of Therapy:  INR 2-3 Heparin level 0.3-0.7 units/ml Monitor platelets by anticoagulation protocol: Yes   Plan:  Continue heparin at 1000 units/hr Warfarin 5mg *1 tonight F/u daily heparin level, CBC, and INR  Melburn Popper, PharmD Clinical Pharmacy Resident Pager: [phone] 09/22/15 7:12 AM

## 2015-09-22 NOTE — Progress Notes (Signed)
CARDIAC REHAB PHASE I   PRE:  Rate/Rhythm: 88 SR  BP:  Supine:   Sitting: 99/74  Standing:    SaO2:   MODE:  Ambulation: 350 ft   POST:  Rate/Rhythm: 93 SR  BP:  Supine:   Sitting: 114/70  Standing:    SaO2:  1035-1125 Pt walked 350 ft on RA with steady gait. No CP. Tolerated well. Completed MI education with pt and daughter who voiced understanding. Gave CHF booklet since EF low and reviewed when to call MD. Encouraged daily weights, 2000 mg sodium restriction. Gave ex ed. Offered to have pharmacist see re Coumadin but pt stated did not need to see.   Graylon Good, RN BSN  09/22/2015 11:20 AM

## 2015-09-23 DIAGNOSIS — I5021 Acute systolic (congestive) heart failure: Secondary | ICD-10-CM

## 2015-09-23 LAB — CBC
HCT: 41.5 % (ref 36.0–46.0)
HEMOGLOBIN: 13.8 g/dL (ref 12.0–15.0)
MCH: 29.7 pg (ref 26.0–34.0)
MCHC: 33.3 g/dL (ref 30.0–36.0)
MCV: 89.2 fL (ref 78.0–100.0)
Platelets: 214 10*3/uL (ref 150–400)
RBC: 4.65 MIL/uL (ref 3.87–5.11)
RDW: 12.6 % (ref 11.5–15.5)
WBC: 6.6 10*3/uL (ref 4.0–10.5)

## 2015-09-23 LAB — GLUCOSE, CAPILLARY
GLUCOSE-CAPILLARY: 271 mg/dL — AB (ref 65–99)
Glucose-Capillary: 231 mg/dL — ABNORMAL HIGH (ref 65–99)
Glucose-Capillary: 212 mg/dL — ABNORMAL HIGH (ref 65–99)
Glucose-Capillary: 241 mg/dL — ABNORMAL HIGH (ref 65–99)

## 2015-09-23 LAB — HEPARIN LEVEL (UNFRACTIONATED)
HEPARIN UNFRACTIONATED: 0.33 [IU]/mL (ref 0.30–0.70)
Heparin Unfractionated: 0.28 IU/mL — ABNORMAL LOW (ref 0.30–0.70)
Heparin Unfractionated: 0.24 IU/mL — ABNORMAL LOW (ref 0.30–0.70)

## 2015-09-23 LAB — PROTIME-INR
INR: 1.05
Prothrombin Time: 13.8 seconds (ref 11.4–15.2)

## 2015-09-23 MED ORDER — WARFARIN VIDEO
Freq: Once | Status: AC
  Administered 2015-09-23: 15:00:00

## 2015-09-23 MED ORDER — COUMADIN BOOK
Freq: Once | Status: AC
  Administered 2015-09-23: 18:00:00
  Filled 2015-09-23: qty 1

## 2015-09-23 MED ORDER — WARFARIN SODIUM 7.5 MG PO TABS
7.5000 mg | ORAL_TABLET | Freq: Once | ORAL | Status: AC
  Administered 2015-09-23: 7.5 mg via ORAL
  Filled 2015-09-23: qty 1

## 2015-09-23 NOTE — Progress Notes (Signed)
CARDIAC REHAB PHASE I   PRE:  Rate/Rhythm: 78 SR  BP:  Supine: 98/64  Sitting:   Standing:    SaO2: 95% RA  MODE:  Ambulation: 550 ft   POST:  Rate/Rhythm: 76  BP:  Supine: 10272  Sitting:   Standing:    SaO2: 100% RA  0943-1010 Patient feeling a little down prior to walk. Tolerated ambulation well, gait steady. Feeling better after walk. To bed, IV intact, call bell within reach, VSS.  Shawna Sparks

## 2015-09-23 NOTE — Discharge Instructions (Signed)

## 2015-09-23 NOTE — Progress Notes (Signed)
ANTICOAGULATION CONSULT NOTE - Follow-up Consult  Pharmacy Consult for heparin and warfarin Indication: possible apical thrombus  Patient Measurements: Height: 5\' 4"  (162.6 cm) Weight: 171 lb 4.8 oz (77.7 kg) IBW/kg (Calculated) : 54.7 Heparin Dosing Weight: 71.2  Vital Signs: Temp: 99.4 F (37.4 C) (08/11 2023) Temp Source: Oral (08/11 2023) BP: 107/72 (08/11 2023) Pulse Rate: 83 (08/11 2023)  Labs:  Recent Labs  09/20/15 1221 09/20/15 1231 09/20/15 1417  09/20/15 1920 09/21/15 0016 09/21/15 2135 09/22/15 0325 09/23/15 0258  HGB 15.4* 15.6* 15.8*  --   --  14.7  --   --  13.8  HCT 45.2 46.0 46.1*  --   --  43.7  --   --  41.5  PLT 236  --  202  --   --  200  --   --  214  APTT 24  --   --   --   --   --   --   --   --   LABPROT 12.7  --   --   < >  --  12.8  --  13.4 13.8  INR 0.95  --   --   < >  --  0.97  --  1.02 1.05  HEPARINUNFRC  --   --   --   --   --   --  0.60 0.44 0.28*  CREATININE 0.71 0.50 0.64  --   --  0.49  --   --   --   TROPONINI 0.08*  --   --   --  57.17* 39.33*  --   --   --   < > = values in this interval not displayed.  Estimated Creatinine Clearance: 70.7 mL/min (by C-G formula based on SCr of 0.8 mg/dL).  Assessment: 65 yo female admitted for STEMI, on ECHO could not r/o possible apical thrombus. Pharmacy consulted for heparin bridge to warfarin. Of note, patient is also on ASA and brilinta. Heparin level down to slightly subtherapeutic (0.28) on 1000 units/hr. No issues with infusion or bleeding noted. INR with small movement past 2 doses of 5mg .  Goal of Therapy:  INR 2-3 Heparin level 0.3-0.7 units/ml Monitor platelets by anticoagulation protocol: Yes   Plan:  Increase heparin to 1150 units/hr Will f/u 6 hr heparin level Warfarin 7.5mg  tonight  Sherlon Handing, PharmD, BCPS Clinical pharmacist, pager [phone] 09/23/15 4:21 AM

## 2015-09-23 NOTE — Progress Notes (Signed)
Inpatient Diabetes Program Recommendations  AACE/ADA: New Consensus Statement on Inpatient Glycemic Control (2015)  Target Ranges:  Prepandial:   less than 140 mg/dL      Peak postprandial:   less than 180 mg/dL (1-2 hours)      Critically ill patients:  140 - 180 mg/dL   Results for Shawna Sparks, Shawna Sparks (MRN [id number]) as of 09/23/2015 14:24  Ref. Range 09/22/2015 08:51 09/22/2015 11:20 09/22/2015 16:19 09/22/2015 21:07  Glucose-Capillary Latest Ref Range: 65 - 99 mg/dL 239 (H) 378 (H) 193 (H) 246 (H)   Results for Shawna Sparks, Shawna Sparks (MRN [id number]) as of 09/23/2015 14:24  Ref. Range 09/23/2015 06:25 09/23/2015 11:32  Glucose-Capillary Latest Ref Range: 65 - 99 mg/dL 271 (H) 231 (H)    Home DM Meds: Lantus 20-30 units QHS       Novolog 20 units tidwc  Current Insulin Orders: Lantus 20 units daily      Novolog Moderate Correction Scale/ SSI (0-15 units) TID AC       MD- Please consider the following in-hospital insulin adjustments:  1. Increase Lantus to 30 units daily  2. Start Novolog Meal Coverage: Novolog 5 units tid with meals (25% of home dose of Novolog)     --Will follow patient during hospitalization--  Wyn Quaker RN, MSN, CDE Diabetes Coordinator Inpatient Glycemic Control Team Team Pager: [phone] (8a-5p)

## 2015-09-23 NOTE — Progress Notes (Signed)
ANTICOAGULATION CONSULT NOTE - Follow-up Consult  Pharmacy Consult for heparin and warfarin Indication: possible apical thrombus  Patient Measurements: Height: 5\' 4"  (162.6 cm) Weight: 165 lb 8 oz (75.1 kg) IBW/kg (Calculated) : 54.7 Heparin Dosing Weight: 71.2  Vital Signs: Temp: 98.2 F (36.8 C) (08/12 1344) Temp Source: Oral (08/12 1344) BP: 99/61 (08/12 1344) Pulse Rate: 76 (08/12 1344)  Labs:  Recent Labs  09/20/15 1920 09/21/15 0016  09/22/15 0325 09/23/15 0258 09/23/15 1056 09/23/15 1619  HGB  --  14.7  --   --  13.8  --   --   HCT  --  43.7  --   --  41.5  --   --   PLT  --  200  --   --  214  --   --   LABPROT  --  12.8  --  13.4 13.8  --   --   INR  --  0.97  --  1.02 1.05  --   --   HEPARINUNFRC  --   --   < > 0.44 0.28* 0.33 0.24*  CREATININE  --  0.49  --   --   --   --   --   TROPONINI 57.17* 39.33*  --   --   --   --   --   < > = values in this interval not displayed.  Estimated Creatinine Clearance: 69.6 mL/min (by C-G formula based on SCr of 0.8 mg/dL).  Assessment: 65 yo female admitted for STEMI, on ECHO could not r/o possible apical thrombus. Pharmacy consulted for heparin bridge to warfarin. Of note, patient is also on ASA and brilinta. Plan is to continue ASA for 2 weeks and then go to coumadin and brilinta alone.No bleeding noted.  CBC stable.   HL- 0.24  On 1150 units/hr - subtherapeutic  INR- 1.05  Goal of Therapy:  INR 2-3 Heparin level 0.3-0.7 units/ml Monitor platelets by anticoagulation protocol: Yes   Plan:  Increase heparin to 1300 units/hr Will f/u 6 hr heparin level Warfarin 7.5mg  tonight Daily INR, CBC  Jearld Fenton D Pharmacy Resident [phone] 09/23/15 5:27 PM

## 2015-09-23 NOTE — Progress Notes (Signed)
Subjective:    Mild fatigue  Objective:   Temp:  [98 F (36.7 C)-99.4 F (37.4 C)] 98.1 F (36.7 C) (08/12 0517) Pulse Rate:  [75-83] 76 (08/12 0818) Resp:  [20] 20 (08/12 0517) BP: (100-107)/(56-75) 103/75 (08/12 0818) SpO2:  [94 %-98 %] 98 % (08/12 0517) Weight:  [165 lb 8 oz (75.1 kg)] 165 lb 8 oz (75.1 kg) (08/12 0517) Last BM Date: 09/23/15  Filed Weights   09/20/15 1218 09/20/15 1400 09/23/15 0517  Weight: 170 lb (77.1 kg) 171 lb 4.8 oz (77.7 kg) 165 lb 8 oz (75.1 kg)    Intake/Output Summary (Last 24 hours) at 09/23/15 0951 Last data filed at 09/23/15 0730  Gross per 24 hour  Intake           708.17 ml  Output                0 ml  Net           708.17 ml     Exam:  General: NAD  HEENT: sclera clear, throat clear  Resp: CTAB  Cardiac: RRR, no m/r/g, no jvd  GI: abdomen soft, NT, ND  MSK: no LE edema  Neuro: no focal deficits  Psych: appropriate affect  Lab Results:  Basic Metabolic Panel:  Recent Labs Lab 09/20/15 1221 09/20/15 1231 09/20/15 1417 09/21/15 0016  NA 135 135  --  133*  K 3.6 3.6  --  3.6  CL 97* 96*  --  102  CO2 23  --   --  22  GLUCOSE 396* 402*  --  303*  BUN 14 15  --  11  CREATININE 0.71 0.50 0.64 0.49  CALCIUM 9.9  --   --  9.0    Liver Function Tests:  Recent Labs Lab 09/20/15 1221 09/21/15 0016  AST 18 156*  ALT 15 39  ALKPHOS 59 54  BILITOT 0.9 0.9  PROT 6.7 5.8*  ALBUMIN 4.1 3.7    CBC:  Recent Labs Lab 09/20/15 1417 09/21/15 0016 09/23/15 0258  WBC 9.3 7.4 6.6  HGB 15.8* 14.7 13.8  HCT 46.1* 43.7 41.5  MCV 88.1 87.9 89.2  PLT 202 200 214    Cardiac Enzymes:  Recent Labs Lab 09/20/15 1221 09/20/15 1920 09/21/15 0016  TROPONINI 0.08* 57.17* 39.33*    BNP: No results for input(s): PROBNP in the last 8760 hours.  Coagulation:  Recent Labs Lab 09/21/15 0016 09/22/15 0325 09/23/15 0258  INR 0.97 1.02 1.05    ECG:   Medications:   Scheduled Medications: . aspirin   81 mg Oral Daily  . atorvastatin  80 mg Oral q1800  . carvedilol  3.125 mg Oral BID WC  . citalopram  20 mg Oral Daily  . insulin aspart  0-15 Units Subcutaneous TID WC  . insulin glargine  20 Units Subcutaneous Daily  . living well with diabetes book   Does not apply Once  . sodium chloride flush  3 mL Intravenous Q12H  . sodium chloride flush  3 mL Intravenous Q12H  . ticagrelor  90 mg Oral BID  . traZODone  100 mg Oral QHS  . vitamin B-12  1,000 mcg Oral Daily  . warfarin  7.5 mg Oral ONCE-1800  . Warfarin - Pharmacist Dosing Inpatient   Does not apply q1800     Infusions: . sodium chloride Stopped (09/20/15 1742)  . heparin 1,150 Units/hr (09/23/15 0432)     PRN Medications:  sodium chloride, sodium  chloride, acetaminophen, ondansetron (ZOFRAN) IV, sodium chloride flush, sodium chloride flush, zolpidem     Assessment/Plan    1. STEMI/CAD/ICM - admitted with anterior STEMI, s/p DES to LAD - echo 09/2015 LVEF 35-40%, apical hypokinesis, cannot excluded apical thrombus - started on anticoag for potential thrombus, plan is to stop ASA in 2 weeks and contniue coumadin and brilinta per Dr Scarlette Calico. - medical therapy with ASA, atorva 80, coreg 3.125mg  bid, brilinta. Soft bp's have not started ACE-I - some mild fatigue, continue to monitor at this time, perhaps side effect of coreg.   2. Possible LV apical thrombus - presented with anterior MI, apical hypokinesis on echo with possible thrombus - has been started on anticoag, heparin bridge on coumadin.  - INR remains subtherapeutic. I think the risk of triple therapy and home lovenox would be very high risk for bleeding, would continue inpatient monitored IV heparin bridge at this tme        Carlyle Dolly, M.D.

## 2015-09-23 NOTE — Progress Notes (Signed)
ANTICOAGULATION CONSULT NOTE - Follow-up Consult  Pharmacy Consult for heparin and warfarin Indication: possible apical thrombus  Patient Measurements: Height: 5\' 4"  (162.6 cm) Weight: 165 lb 8 oz (75.1 kg) IBW/kg (Calculated) : 54.7 Heparin Dosing Weight: 71.2  Vital Signs: Temp: 98.1 F (36.7 C) (08/12 0517) Temp Source: Oral (08/12 0517) BP: 103/75 (08/12 0818) Pulse Rate: 76 (08/12 0818)  Labs:  Recent Labs  09/20/15 1221 09/20/15 1231 09/20/15 1417  09/20/15 1920 09/21/15 0016 09/21/15 2135 09/22/15 0325 09/23/15 0258  HGB 15.4* 15.6* 15.8*  --   --  14.7  --   --  13.8  HCT 45.2 46.0 46.1*  --   --  43.7  --   --  41.5  PLT 236  --  202  --   --  200  --   --  214  APTT 24  --   --   --   --   --   --   --   --   LABPROT 12.7  --   --   < >  --  12.8  --  13.4 13.8  INR 0.95  --   --   < >  --  0.97  --  1.02 1.05  HEPARINUNFRC  --   --   --   --   --   --  0.60 0.44 0.28*  CREATININE 0.71 0.50 0.64  --   --  0.49  --   --   --   TROPONINI 0.08*  --   --   --  57.17* 39.33*  --   --   --   < > = values in this interval not displayed.  Estimated Creatinine Clearance: 69.6 mL/min (by C-G formula based on SCr of 0.8 mg/dL).  Assessment: 65 yo female admitted for STEMI, on ECHO could not r/o possible apical thrombus. Pharmacy consulted for heparin bridge to warfarin. Of note, patient is also on ASA and brilinta. Plan is to continue ASA for 2 weeks and then go to coumadin and brilinta alone.No bleeding noted.  CBC stable.   HL- 0.33  On 1150 units./hr - therapeutic X 1 INR- 1.05  Goal of Therapy:  INR 2-3 Heparin level 0.3-0.7 units/ml Monitor platelets by anticoagulation protocol: Yes   Plan:  Continue heparin 1150 units/hr Will f/u 6 hr heparin level Warfarin 7.5mg  tonight Daily INR, CBC  Jearld Fenton D Pharmacy Resident [phone] 09/23/15 12:14 PM

## 2015-09-24 ENCOUNTER — Encounter (HOSPITAL_COMMUNITY): Payer: Self-pay | Admitting: Student

## 2015-09-24 DIAGNOSIS — F988 Other specified behavioral and emotional disorders with onset usually occurring in childhood and adolescence: Secondary | ICD-10-CM

## 2015-09-24 HISTORY — DX: Other specified behavioral and emotional disorders with onset usually occurring in childhood and adolescence: F98.8

## 2015-09-24 LAB — HEPARIN LEVEL (UNFRACTIONATED)
Heparin Unfractionated: 0.49 IU/mL (ref 0.30–0.70)
Heparin Unfractionated: 0.6 IU/mL (ref 0.30–0.70)

## 2015-09-24 LAB — CBC
HEMATOCRIT: 40.7 % (ref 36.0–46.0)
HEMOGLOBIN: 13.5 g/dL (ref 12.0–15.0)
MCH: 29.5 pg (ref 26.0–34.0)
MCHC: 33.2 g/dL (ref 30.0–36.0)
MCV: 89.1 fL (ref 78.0–100.0)
PLATELETS: 184 10*3/uL (ref 150–400)
RBC: 4.57 MIL/uL (ref 3.87–5.11)
RDW: 12.7 % (ref 11.5–15.5)
WBC: 7 10*3/uL (ref 4.0–10.5)

## 2015-09-24 LAB — PROTIME-INR
INR: 1.09
INR: 1.12
PROTHROMBIN TIME: 14.4 s (ref 11.4–15.2)
Prothrombin Time: 14.1 seconds (ref 11.4–15.2)

## 2015-09-24 LAB — GLUCOSE, CAPILLARY: Glucose-Capillary: 217 mg/dL — ABNORMAL HIGH (ref 65–99)

## 2015-09-24 MED ORDER — BLOOD GLUCOSE MONITOR KIT: PACK | 0 refills | Status: DC

## 2015-09-24 MED ORDER — TICAGRELOR 90 MG PO TABS: 90.0000 mg | ORAL_TABLET | Freq: Two times a day (BID) | ORAL | 10 refills | Status: DC

## 2015-09-24 MED ORDER — WARFARIN SODIUM 7.5 MG PO TABS: 7.5000 mg | ORAL_TABLET | Freq: Once | ORAL | Status: DC

## 2015-09-24 MED ORDER — ATORVASTATIN CALCIUM 80 MG PO TABS: 80.0000 mg | ORAL_TABLET | Freq: Every day | ORAL | 3 refills | Status: DC

## 2015-09-24 MED ORDER — CARVEDILOL 3.125 MG PO TABS: 3.1250 mg | ORAL_TABLET | Freq: Two times a day (BID) | ORAL | 6 refills | Status: DC

## 2015-09-24 MED ORDER — TICAGRELOR 90 MG PO TABS: 90.0000 mg | ORAL_TABLET | Freq: Two times a day (BID) | ORAL | 0 refills | Status: DC

## 2015-09-24 MED ORDER — GLUCOSE BLOOD VI STRP: ORAL_STRIP | 12 refills | Status: DC

## 2015-09-24 MED ORDER — WARFARIN SODIUM 5 MG PO TABS: 7.5000 mg | ORAL_TABLET | Freq: Every day | ORAL | 6 refills | Status: DC

## 2015-09-24 MED ORDER — ASPIRIN 81 MG PO CHEW: 81.0000 mg | CHEWABLE_TABLET | Freq: Every day | ORAL | Status: DC

## 2015-09-24 NOTE — Discharge Summary (Signed)
Discharge Summary    Patient ID: Shawna Sparks,  MRN: [id number], DOB/AGE: [birth year] 65 y.o.  Admit date: 09/20/2015 Discharge date: 09/24/2015  Primary Care Provider: Donnajean Lopes Primary Cardiologist: New - Wishes to follow-up with Dr. Irish Lack  Discharge Diagnoses    Principal Problem:   Acute ST elevation myocardial infarction (STEMI) involving left anterior descending (LAD) coronary artery (HCC) Active Problems:   Hyperlipidemia   Diabetes mellitus type 2, uncontrolled ([place])   Essential hypertension   STEMI (ST elevation myocardial infarction) ([place])   History of Present Illness     Shawna Sparks is a 65 y.o. female with past medical history of uncontrolled IDDM, HTN, HLD, and no prior cardiac history who presented to [facility] ED on 09/20/2015 as a CODE STEMI.  Patient reported that earlier that morning she started having crushing chest pain that radiated into the left arm and was associated with N/V and diaphoresis. EMS was called and initial EKG showed anterloateral ST elevation. CODE STEMI was activated and she was brought to [facility] for an emergent cardiac catheterization.   She denied any history of HTN or HLD even thought this was listed in her PMH and denied taking any medications for this. Voiced noncompliance with her diabetes medications.    Hospital Course     Consultants: None  Her cardiac catheterization showed 3 vessel CAD with 100% stenosis of the mid-LAD. This was treated with a DES. Diffuse disease was noted throughout and aggressive risk factor modification was recommended. The full report is included below. She will be on DAPT for at least one year.   The following morning, she denied any repeat episodes of chest discomfort. Troponin values peaked at 57.17. Lipid Panel showed an LDL of 149. A1c 11.7. Echocardiogram showed an EF of  35% to 40% with akinesis of the mid-apical anteroseptal, inferoseptal, inferoapical, anteroapical, and  apical myocardium. There was concern for a possible apical thrombus and her echo showed apical hypokinesis but apical thrombus could not be ruled out. She was started on Heparin and Coumadin per Pharmacy consult.  With her requirement for Coumadin, she will be on ASA, Brilinta, and Coumadin at time of discharge but stop ASA in 2 weeks. Other medical therapy will include high-intensity statin and Carvedilol. Her BP does not allow for an ACE-I/ARB or Spironolactone at this time.     The assistance of the Diabetes Coordinator was greatly appreciated during the patient's admission. The patient understands the importance of getting her diabetes under control and an Rx for a glucose monitor and testing strips was provided.  She was last examined by Dr. Harl Bowie and deemed stable for discharge. She will be sent home on Coumadin 7.47m daily per Pharmacy recommendations (INR 1.12 at time of discharge). Will arrange for INR check in 3 days (no Lovenox bridge due to her already being on triple therapy and increased risk of bleeding). She wishes for Dr. VIrish Lackto be her Primary Cardiologist and a staff message has been sent to arrange for a 7-day TCM appointment with an APP. She was discharged home in stable condition.   _____________  Discharge Vitals Blood pressure 114/73, pulse 71, temperature 98.4 F (36.9 C), temperature source Oral, resp. rate 18, height _0  (1.626 m), weight 163 lb 3.2 oz (74 kg), SpO2 99 %.  Filed Weights   09/20/15 1400 09/23/15 0517 09/24/15 0525  Weight: 171 lb 4.8 oz (77.7 kg) 165 lb 8 oz (75.1 kg) 163 lb 3.2 oz (  74 kg)    Labs & Radiologic Studies     CBC  Recent Labs  09/23/15 0258 09/23/15 2340  WBC 6.6 7.0  HGB 13.8 13.5  HCT 41.5 40.7  MCV 89.2 89.1  PLT 214 184     Diagnostic Studies/Procedures     Cardiac Catheterization: 09/20/2015  Prox RCA to Dist RCA lesion, 20 %stenosed.  3rd RPLB lesion, 100 %stenosed.  Dist LAD-2 lesion, 90 %stenosed.  Lat  1st Diag lesion, 99 %stenosed.  1st Diag lesion, 90 %stenosed.  Ost 1st Mrg to 1st Mrg lesion, 70 %stenosed.  Ost 3rd Mrg to 3rd Mrg lesion, 70 %stenosed.  Dist LAD-1 lesion, 90 %stenosed.  Post intervention, there is a 30% residual stenosis.  Prox LAD lesion, 100 %stenosed.  Post intervention, there is a 0% residual stenosis.  A STENT SYNERGY DES [id number] drug eluting stent was successfully placed.  There is moderate left ventricular systolic dysfunction.  The left ventricular ejection fraction is 35-45% by visual estimate.  LV end diastolic pressure is normal.   1. 3 vessel obstructive disease with diffuse CAD. Culprit lesion is occlusion of the mid LAD 2. Moderate LV dysfunction with normal LVEDP 3. Successful stenting of the mid LAD with DES. The distal LAD is still diffusely diseased.   Plan: DAPT for at least one year. Aggressive risk factor modification.  Echocardiogram: 09/21/2015 Study Conclusions  - Left ventricle: The cavity size was normal. There was mild   concentric hypertrophy. Systolic function was moderately reduced.   The estimated ejection fraction was in the range of 35% to 40%.   Akinesis of the mid-apical anteroseptal, inferoseptal,   inferoapical, anteroapical, and apical myocardium. Doppler   parameters are consistent with abnormal left ventricular   relaxation (grade 1 diastolic dysfunction). Doppler parameters   are consistent with indeterminate ventricular filling pressure.   Cannot exclude apical thrombus. - Aortic valve: Transvalvular velocity was within the normal range.   There was no stenosis. There was no regurgitation. - Mitral valve: Transvalvular velocity was within the normal range.   There was no evidence for stenosis. There was trivial   regurgitation. - Right ventricle: The cavity size was normal. Wall thickness was   normal. Systolic function was normal. - Atrial septum: No defect or patent foramen ovale was identified   by  color flow Doppler. - Tricuspid valve: There was mild regurgitation. - Pulmonary arteries: Systolic pressure was within the normal   range. PA peak pressure: 21 mm Hg (S).  Disposition   Pt is being discharged home today in good condition.  Follow-up Plans & Appointments    Follow-up Information    Larae Grooms, MD .   Specialties:  Cardiology, Radiology, Interventional Cardiology Why:  The office will call you to arrange Cardiology follow-up. If you do not hear from them in 3 business days, please call the number provided. Will need an INR check on 8/16 or 8/17. Office will arrange this as well.  Contact information: [address] [place] [address] [phone]          Discharge Instructions    AMB Referral to Cardiac Rehabilitation - Phase II    Complete by:  As directed   Diagnosis:  STEMI   Amb Referral to Cardiac Rehabilitation    Complete by:  As directed   Diagnosis:   STEMI Coronary Stents     Diet - low sodium heart healthy    Complete by:  As directed   Discharge instructions  Complete by:  As directed   PLEASE REMEMBER TO BRING ALL OF YOUR MEDICATIONS TO EACH OF YOUR FOLLOW-UP OFFICE VISITS.  PLEASE ATTEND ALL SCHEDULED FOLLOW-UP APPOINTMENTS.   Activity: Increase activity slowly as tolerated. You may not return to work until cleared by your cardiologist. No lifting over 10 lbs for 4 weeks. No sexual activity for 4 weeks.   Wound Care: You may wash cath site gently with soap and water. Keep cath site clean and dry. If you notice pain, swelling, bleeding or pus at your cath site, please call [phone].   Increase activity slowly    Complete by:  As directed      Discharge Medications     Medication List    STOP taking these medications   ibuprofen 200 MG tablet Commonly known as:  ADVIL,MOTRIN   traMADol 50 MG tablet Commonly known as:  ULTRAM     TAKE these medications   aspirin 81 MG chewable tablet Chew 1 tablet  (81 mg total) by mouth daily.   atorvastatin 80 MG tablet Commonly known as:  LIPITOR Take 1 tablet (80 mg total) by mouth daily at 6 PM.   blood glucose meter kit and supplies Kit Dispense based on patient and insurance preference. Use up to four times daily as directed. (FOR ICD-9 250.00, 250.01).   carvedilol 3.125 MG tablet Commonly known as:  COREG Take 1 tablet (3.125 mg total) by mouth 2 (two) times daily with a meal.   citalopram 40 MG tablet Commonly known as:  CELEXA Take 20 mg by mouth daily.   fluconazole 150 MG tablet Commonly known as:  DIFLUCAN 150 mg (1 tablet) daily as needed for yeast   fluocinonide 0.05 % external solution Commonly known as:  LIDEX Apply 1 application topically at bedtime as needed (for scaly itchy skiin).   glucose blood test strip Use as instructed   LANTUS SOLOSTAR 100 UNIT/ML Solostar Pen Generic drug:  Insulin Glargine Inject 20-30 Units into the skin daily at 10 pm. Don't let sugar get below 200; will bottom pt out   NOVOLOG 100 UNIT/ML injection Generic drug:  insulin aspart Take 20 units three times a day with meals as needed per sliding scale   ticagrelor 90 MG Tabs tablet Commonly known as:  BRILINTA Take 1 tablet (90 mg total) by mouth 2 (two) times daily.   traZODone 100 MG tablet Commonly known as:  DESYREL Take 1 tablet (100 mg total) by mouth at bedtime.   valACYclovir 500 MG tablet Commonly known as:  VALTREX Take 1,000 mg by mouth 2 (two) times daily as needed (for outbreaks).   vitamin B-12 1000 MCG tablet Commonly known as:  CYANOCOBALAMIN Take 1,000 mcg by mouth daily.   Vitamin D (Ergocalciferol) 50000 units Caps capsule Commonly known as:  DRISDOL Take 1 capsule (50,000 units) every Friday   VYVANSE 50 MG capsule Generic drug:  lisdexamfetamine Take 50 mg by mouth at bedtime as needed (for shift changes).   warfarin 5 MG tablet Commonly known as:  COUMADIN Take 1.5 tablets (7.5 mg total) by mouth  daily.   zolpidem 10 MG tablet Commonly known as:  AMBIEN TAKE 1 TABLET BY MOUTH AT BEDTIME AS NEEDED FOR SLEEP What changed:  See the new instructions.       Aspirin prescribed at discharge?  Yes High Intensity Statin Prescribed? (Lipitor 40-53m or Crestor 20-47m: Yes Beta Blocker Prescribed? Yes For EF 40% or less, Was ACEI/ARB Prescribed? No: Hypotensive ADP Receptor Inhibitor  Prescribed? (i.e. Plavix etc.-Includes Medically Managed Patients): Yes For EF <40%, Aldosterone Inhibitor Prescribed? No: Hypotensive Was EF assessed during THIS hospitalization? Yes Was Cardiac Rehab II ordered? (Included Medically managed Patients): Yes   Allergies Allergies  Allergen Reactions  . Codeine Shortness Of Breath  . Benadryl [Diphenhydramine Hcl (Sleep)] Itching  . Dilaudid [Hydromorphone Hcl] Rash  . Flexeril [Cyclobenzaprine] Hives, Itching and Rash     Outstanding Labs/Studies   INR Check in 3 days.   Duration of Discharge Encounter   Greater than 30 minutes including physician time.  Signed, Erma Heritage, PA-C 09/24/2015, 11:03 AM

## 2015-09-24 NOTE — Progress Notes (Signed)
ANTICOAGULATION CONSULT NOTE - Follow-up Consult  Pharmacy Consult for heparin and warfarin Indication: possible apical thrombus  Patient Measurements: Height: 5\' 4"  (162.6 cm) Weight: 163 lb 3.2 oz (74 kg) IBW/kg (Calculated) : 54.7 Heparin Dosing Weight: 71.2  Vital Signs: Temp: 98.4 F (36.9 C) (08/13 0525) Temp Source: Oral (08/13 0525) BP: 114/73 (08/13 0759) Pulse Rate: 71 (08/13 0759)  Labs:  Recent Labs  09/23/15 0258  09/23/15 1619 09/23/15 2340 09/23/15 2348 09/24/15 0826  HGB 13.8  --   --  13.5  --   --   HCT 41.5  --   --  40.7  --   --   PLT 214  --   --  184  --   --   LABPROT 13.8  --   --   --  14.1 14.4  INR 1.05  --   --   --  1.09 1.12  HEPARINUNFRC 0.28*  < > 0.24*  --  0.49 0.60  < > = values in this interval not displayed.  Estimated Creatinine Clearance: 69.1 mL/min (by C-G formula based on SCr of 0.8 mg/dL).  Assessment: 65 yo female admitted for STEMI, on ECHO could not r/o possible apical thrombus. Pharmacy consulted for heparin bridge to warfarin. Of note, patient is also on ASA and brilinta. Plan is to continue ASA for 2 weeks and then go to coumadin and brilinta alone. Talked to Cards PA and no bleeding or line issues noted. MD is okay with pt having subtherapeutic INR  for a few days at home if discharged today.   HL therapeutic X 2 on 1300 units/hr.  INR- 1.12  Goal of Therapy:  INR 2-3 Heparin level 0.3-0.7 units/ml Monitor platelets by anticoagulation protocol: Yes   Plan:  Continue heparin 1300 units/hr Warfarin 7.5mg  tonight Daily INR, CBC Planned discharge today - Recommended 7.5 mg daily until clinic follow-up (~ Tues 8/15)  Jearld Fenton D Pharmacy Resident [phone] 09/24/15 11:51 AM

## 2015-09-24 NOTE — Progress Notes (Signed)
Patient to D/C home with daughter. Education done. Handouts given on Coumadin and Birlinta. IV removed. Tele monitor. CCMD notified. Personal belongings given to patient. No Chest Pain. No SOB. No further questions at this time. Patient D/C home with daughter.  Domingo Dimes RN

## 2015-09-24 NOTE — Progress Notes (Signed)
ANTICOAGULATION CONSULT NOTE - Follow Up Consult  Pharmacy Consult for heparin Indication: possible apical thrombus  Labs:  Recent Labs  09/22/15 0325 09/23/15 0258 09/23/15 1056 09/23/15 1619 09/23/15 2340 09/23/15 2348  HGB  --  13.8  --   --  13.5  --   HCT  --  41.5  --   --  40.7  --   PLT  --  214  --   --  184  --   LABPROT 13.4 13.8  --   --   --  14.1  INR 1.02 1.05  --   --   --  1.09  HEPARINUNFRC 0.44 0.28* 0.33 0.24*  --  0.49    Assessment/Plan:  65yo female therapeutic on heparin after rate changes. Will continue gtt at current rate and confirm stable with am labs.   Wynona Neat, PharmD, BCPS  09/24/2015,12:21 AM

## 2015-09-24 NOTE — Progress Notes (Signed)
Subjective:    No complaints  Objective:   Temp:  [98.2 F (36.8 C)-98.9 F (37.2 C)] 98.4 F (36.9 C) (08/13 0525) Pulse Rate:  [71-78] 71 (08/13 0759) Resp:  [18] 18 (08/13 0525) BP: (99-114)/(56-73) 114/73 (08/13 0759) SpO2:  [98 %-100 %] 99 % (08/13 0525) Weight:  [163 lb 3.2 oz (74 kg)] 163 lb 3.2 oz (74 kg) (08/13 0525) Last BM Date: 09/24/15  Filed Weights   09/20/15 1400 09/23/15 0517 09/24/15 0525  Weight: 171 lb 4.8 oz (77.7 kg) 165 lb 8 oz (75.1 kg) 163 lb 3.2 oz (74 kg)    Intake/Output Summary (Last 24 hours) at 09/24/15 0855 Last data filed at 09/23/15 1630  Gross per 24 hour  Intake              720 ml  Output                0 ml  Net              720 ml    Telemetry: SR  Exam:  General: NAD  HEENT: sclera clear, throat clear  Resp: CTAB  Cardiac: RRR, no m/r/g, no jvd  GI: abdomen soft, NT, ND  MSK: no LE edema  Neuro: no focal deficits  Psych: appropriate affect  Lab Results:  Basic Metabolic Panel:  Recent Labs Lab 09/20/15 1221 09/20/15 1231 09/20/15 1417 09/21/15 0016  NA 135 135  --  133*  K 3.6 3.6  --  3.6  CL 97* 96*  --  102  CO2 23  --   --  22  GLUCOSE 396* 402*  --  303*  BUN 14 15  --  11  CREATININE 0.71 0.50 0.64 0.49  CALCIUM 9.9  --   --  9.0    Liver Function Tests:  Recent Labs Lab 09/20/15 1221 09/21/15 0016  AST 18 156*  ALT 15 39  ALKPHOS 59 54  BILITOT 0.9 0.9  PROT 6.7 5.8*  ALBUMIN 4.1 3.7    CBC:  Recent Labs Lab 09/21/15 0016 09/23/15 0258 09/23/15 2340  WBC 7.4 6.6 7.0  HGB 14.7 13.8 13.5  HCT 43.7 41.5 40.7  MCV 87.9 89.2 89.1  PLT 200 214 184    Cardiac Enzymes:  Recent Labs Lab 09/20/15 1221 09/20/15 1920 09/21/15 0016  TROPONINI 0.08* 57.17* 39.33*    BNP: No results for input(s): PROBNP in the last 8760 hours.  Coagulation:  Recent Labs Lab 09/22/15 0325 09/23/15 0258 09/23/15 2348  INR 1.02 1.05 1.09    ECG:   Medications:   Scheduled  Medications: . aspirin  81 mg Oral Daily  . atorvastatin  80 mg Oral q1800  . carvedilol  3.125 mg Oral BID WC  . citalopram  20 mg Oral Daily  . insulin aspart  0-15 Units Subcutaneous TID WC  . insulin glargine  20 Units Subcutaneous Daily  . living well with diabetes book   Does not apply Once  . sodium chloride flush  3 mL Intravenous Q12H  . sodium chloride flush  3 mL Intravenous Q12H  . ticagrelor  90 mg Oral BID  . traZODone  100 mg Oral QHS  . vitamin B-12  1,000 mcg Oral Daily  . Warfarin - Pharmacist Dosing Inpatient   Does not apply q1800     Infusions: . sodium chloride Stopped (09/20/15 1742)  . heparin 1,300 Units/hr (09/23/15 1733)     PRN Medications:  sodium  chloride, sodium chloride, acetaminophen, ondansetron (ZOFRAN) IV, sodium chloride flush, sodium chloride flush, zolpidem     Assessment/Plan     1. STEMI/CAD/ICM - admitted with anterior STEMI, s/p DES to LAD - echo 09/2015 LVEF 35-40%, apical hypokinesis, cannot excluded apical thrombus - started on anticoag for potential thrombus, plan is to stop ASA in 2 weeks and contniue coumadin and brilinta per Dr Scarlette Calico. - medical therapy with ASA, atorva 80, coreg 3.125mg  bid, brilinta. Soft bp's have not started ACE-I - some mild fatigue, continue to monitor at this time, perhaps side effect of coreg or still recovering from her recent MI  2. Possible LV apical thrombus - presented with anterior MI, apical hypokinesis on echo with possible thrombus - has been started on anticoag, heparin bridge on coumadin.  - INR remains subtherapeutic. I think the risk of triple therapy (ASA,brillinta,coumadin) and home lovenox would be very high risk for bleeding - I discussed with patient either staying in hospital until INR is therapeutic, or being discharged and likely having subtherapeutic INR for a few days at home. The risk of embolization is quite low for that short amount of time. She has a possible thrombus,  not definitive but given her risk given her large anterior MI and WMA has been started on coumadin. We will plan for discharge today on current coumadin dosing, will need f/u early this week in coumadin clinic as well as clinic appt within 2 weeks  3. DM2 - pt requests order for glucose monitor and strips at discharge.          Carlyle Dolly, M.D.

## 2015-09-28 ENCOUNTER — Encounter: Payer: 59 | Admitting: Nurse Practitioner

## 2015-10-02 ENCOUNTER — Ambulatory Visit (INDEPENDENT_AMBULATORY_CARE_PROVIDER_SITE_OTHER): Payer: 59 | Admitting: *Deleted

## 2015-10-02 DIAGNOSIS — I213 ST elevation (STEMI) myocardial infarction of unspecified site: Secondary | ICD-10-CM

## 2015-10-02 DIAGNOSIS — I513 Intracardiac thrombosis, not elsewhere classified: Secondary | ICD-10-CM | POA: Insufficient documentation

## 2015-10-02 DIAGNOSIS — Z7901 Long term (current) use of anticoagulants: Secondary | ICD-10-CM | POA: Diagnosis not present

## 2015-10-02 LAB — POCT INR: INR: 1.7

## 2015-10-02 NOTE — Patient Instructions (Signed)

## 2015-10-03 ENCOUNTER — Telehealth: Payer: Self-pay | Admitting: Interventional Cardiology

## 2015-10-03 NOTE — Telephone Encounter (Signed)
Please arrange for 30 min pharmacist OV prior to 8/28 Cecilie Kicks OV at 9 am.

## 2015-10-03 NOTE — Telephone Encounter (Signed)
°  New Prob    Calling to notify coumadin clinic that she is now on Levaquin. States she was told to call when she has been started on antibiotic.

## 2015-10-03 NOTE — Telephone Encounter (Signed)
New message    Appointment Needed Within: 7-10 days  Appointment Type: TCM appointment with an APP. Admitted with STEMI   appt on  8.28.2017 @ 9:00 am

## 2015-10-03 NOTE — Telephone Encounter (Signed)
Spoke with pt.  She has not started Levaquin yet and is unsure the dose or duration.  She had her first INR check yesterday.  INR was subtherapeutic with new clot.  Unsure what effect Levaquin may have since we do not know what dose of Coumadin she will need yet.  Will have her continue with dose we gave her yesterday with follow up on Monday.   Instructed to call with any signs of unusual bruising or bleeding.

## 2015-10-06 ENCOUNTER — Encounter: Payer: Self-pay | Admitting: Cardiology

## 2015-10-08 NOTE — Progress Notes (Signed)
Cardiology Office Note   Date:  10/09/2015   ID:  Shawna Sparks, DOB [birth year], MRN [id number]  PCP:  Donnajean Lopes, MD  Cardiologist:    Dr. Irish Lack   Chief Complaint  Patient presents with  . Hospitalization Follow-up      History of Present Illness: Shawna Sparks is a 65 y.o. female who presents for hospitalization for STEMI of LAD.  D/c 09/24/15.  She has a past medical history of uncontrolled IDDM, HTN, HLD, and no prior cardiac history who presented to [facility] ED on 09/20/2015 as a CODE STEMI.  Patient reported that earlier that morning she started having crushing chest pain that radiated into the left arm and was associated with N/V anddiaphoresis. EMS was called and initial EKG showed anterloateralST elevation. CODE STEMI was activated and she was brought to [facility] for an emergent cardiac cath.  Her cardiac catheterization showed 3 vessel CAD with 100% stenosis of the mid-LAD. This was treated with a DES. Diffuse disease was noted throughout and aggressive risk factor modification was recommended. The full report is included below. She will be on DAPT for at least one year.   Troponin values peaked at 57.17. Lipid Panel showed an LDL of 149. A1c 11.7. Echocardiogram showed an EF of  35% to 40% with akinesis of the mid-apical anteroseptal, inferoseptal,inferoapical, anteroapical, and apical myocardium. There was concern for a possible apical thrombus and her echo showed apical hypokinesis but apical thrombus  at discharge "With her requirement for Coumadin, she will be on ASA, Brilinta, and Coumadin at time of discharge but stop ASA in 2 weeks. Other medical therapy will include high-intensity statin and Carvedilol. Her BP does not allow for an ACE-I/ARB or Spironolactone at this time."     Today she tells me she feels great.  No chest pain and no SOB.  She is monitoring her glucose now.  She does not wish to attend cardiac rehab.  She would like to return  to work in next week or so.  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  She does take Vyvanse to keep her awake.   At discharge it is noted no work for 4 weeks.    She is walking 3 times a week and is watching her diet.    Past Medical History:  Diagnosis Date  . Adrenal adenoma right   accidental finding 6 yrs ago-   . Apical mural thrombus ([place])    a. potential thrombus by echo in 09/2015. Started on Coumadin.  . Arthritis    knees  . Attention deficit disorder (ADD) t  . CAD (coronary artery disease)    a. STEMI 09/2015: 3v disease with 100% stenosis of mid-LAD (DES placed). Management of risk factors recommended for her diffuse disease.  . Depression   . Diabetes mellitus    a. on Insulin, A1c 11.7 in 09/2015  . High cholesterol   . Insomnia   . Ureteral stone left  . Urinary incontinence     Past Surgical History:  Procedure Laterality Date  . CARDIAC CATHETERIZATION  2000   wnl  . CARDIAC CATHETERIZATION N/A 09/20/2015   Procedure: Left Heart Cath and Coronary Angiography;  Surgeon: Peter M Martinique, MD;  Location: [place] [facility];  Service: Cardiovascular;  Laterality: N/A;  . CARDIAC CATHETERIZATION N/A 09/20/2015   Procedure: Coronary Stent Intervention;  Surgeon: Peter M Martinique, MD;  Location: [place] [facility];  Service: Cardiovascular;  Laterality: N/A;  . CHOLECYSTECTOMY  1973  . CYSTOSCOPY/RETROGRADE/URETEROSCOPY  12/27/2010   Procedure: CYSTOSCOPY/RETROGRADE/URETEROSCOPY;  Surgeon: Malka So;  Location: [place];  Service: Urology;  Laterality: N/A;  cystoscopy left retrograde ureterocopy stone exstraction poss holmium laser  . GASTRIC BYPASS  2004  . LUMBAR DISC SURGERY    . LUMBAR FUSION  1997   L 4 - 5  . LUMBAR MICRODISCECTOMY  1996   L4 - 5  . VAGINAL HYSTERECTOMY  1996     Current Outpatient Prescriptions  Medication Sig Dispense Refill  . aspirin 81 MG chewable tablet Chew 1 tablet (81 mg  total) by mouth daily.    Marland Kitchen atorvastatin (LIPITOR) 80 MG tablet Take 1 tablet (80 mg total) by mouth daily at 6 PM. 90 tablet 3  . carvedilol (COREG) 3.125 MG tablet Take 1 tablet (3.125 mg total) by mouth 2 (two) times daily with a meal. 60 tablet 6  . citalopram (CELEXA) 40 MG tablet Take 20 mg by mouth daily.   3  . fluconazole (DIFLUCAN) 150 MG tablet 150 mg (1 tablet) daily as needed for yeast  6  . fluocinonide (LIDEX) 0.05 % external solution Apply 1 application topically at bedtime as needed (for scaly itchy skiin).   2  . LANTUS SOLOSTAR 100 UNIT/ML Solostar Pen Inject 20-30 Units into the skin daily at 10 pm. Don't let sugar get below 200; will bottom pt out  12  . NOVOLOG 100 UNIT/ML injection Take 20 units three times a day with meals as needed per sliding scale  12  . ticagrelor (BRILINTA) 90 MG TABS tablet Take 1 tablet (90 mg total) by mouth 2 (two) times daily. 60 tablet 10  . traZODone (DESYREL) 100 MG tablet Take 1 tablet (100 mg total) by mouth at bedtime. 30 tablet 5  . valACYclovir (VALTREX) 500 MG tablet Take 1,000 mg by mouth 2 (two) times daily as needed (for outbreaks).   3  . vitamin B-12 (CYANOCOBALAMIN) 1000 MCG tablet Take 1,000 mcg by mouth daily.    . Vitamin D, Ergocalciferol, (DRISDOL) 50000 UNITS CAPS capsule Take 1 capsule (50,000 units) every Friday  3  . VYVANSE 50 MG capsule Take 50 mg by mouth at bedtime as needed (for shift changes).   0  . warfarin (COUMADIN) 5 MG tablet Take 1.5 tablets (7.5 mg total) by mouth daily. 45 tablet 6  . zolpidem (AMBIEN) 10 MG tablet Take 10 mg by mouth at bedtime as needed for sleep.     No current facility-administered medications for this visit.     Allergies:   Codeine; Benadryl [diphenhydramine hcl (sleep)]; Dilaudid [hydromorphone hcl]; and Flexeril [cyclobenzaprine]    Social History:  The patient  reports that she has never smoked. She has never used smokeless tobacco. She reports that she drinks alcohol. She  reports that she does not use drugs.   Family History:  The patient's family history includes Congestive Heart Failure in her mother; Pancreatic cancer in her father.    ROS:  General:+ colds no fevers-now on antibiotic, no weight changes Skin:no rashes or ulcers HEENT:no blurred vision, no congestion CV:see HPI PUL:see HPI GI:no diarrhea constipation or melena, no indigestion GU:no hematuria, no dysuria MS:no joint pain, no claudication Neuro:no syncope, no lightheadedness Endo:+diabetes, no thyroid disease  Wt Readings from Last 3 Encounters:  10/09/15 169 lb 12.8 oz (77 kg)  09/24/15 163 lb 3.2 oz (74 kg)  05/11/15 174 lb (78.9 kg)  PHYSICAL EXAM: VS:  BP 112/70   Pulse 91   Ht 5\' 4"  (1.626 m)   Wt 169 lb 12.8 oz (77 kg)   BMI 29.15 kg/m  , BMI Body mass index is 29.15 kg/m. General:Pleasant affect, NAD Skin:Warm and dry, brisk capillary refill HEENT:normocephalic, sclera clear, mucus membranes moist Neck:supple, no JVD, no bruits  Heart:S1S2 RRR without murmur, gallup, rub or click Lungs:clear without rales, rhonchi, or wheezes [id number], non tender, + BS, do not palpate liver spleen or masses Ext:no lower ext edema, 2+ pedal pulses, 2+ radial pulses Neuro:alert and oriented X 3, MAE, follows commands, + facial symmetry    EKG:  EKG is ordered today. The ekg ordered today demonstrates SR with deep t wave inversions V2 V3 but no changes from 09/22/15 + ant wall MI.    Recent Labs: 09/20/2015: TSH 2.613 09/21/2015: ALT 39; BUN 11; Creatinine, Ser 0.49; Potassium 3.6; Sodium 133 09/23/2015: Hemoglobin 13.5; Platelets 184    Lipid Panel    Component Value Date/Time   CHOL 235 (H) 09/21/2015 0016   TRIG 221 (H) 09/21/2015 0016   HDL 42 09/21/2015 0016   CHOLHDL 5.6 09/21/2015 0016   VLDL 44 (H) 09/21/2015 0016   LDLCALC 149 (H) 09/21/2015 0016       Other studies Reviewed: Additional studies/ records that were reviewed today include:   ECHO:  09/21/15 Study Conclusions  - Left ventricle: The cavity size was normal. There was mild   concentric hypertrophy. Systolic function was moderately reduced.   The estimated ejection fraction was in the range of 35% to 40%.   Akinesis of the mid-apical anteroseptal, inferoseptal,   inferoapical, anteroapical, and apical myocardium. Doppler   parameters are consistent with abnormal left ventricular   relaxation (grade 1 diastolic dysfunction). Doppler parameters   are consistent with indeterminate ventricular filling pressure.   Cannot exclude apical thrombus. - Aortic valve: Transvalvular velocity was within the normal range.   There was no stenosis. There was no regurgitation. - Mitral valve: Transvalvular velocity was within the normal range.   There was no evidence for stenosis. There was trivial   regurgitation. - Right ventricle: The cavity size was normal. Wall thickness was   normal. Systolic function was normal. - Atrial septum: No defect or patent foramen ovale was identified   by color flow Doppler. - Tricuspid valve: There was mild regurgitation. - Pulmonary arteries: Systolic pressure was within the normal   range. PA peak pressure: 21 mm Hg (S).   Cath and PCI:  09/20/15    Prox RCA to Dist RCA lesion, 20 %stenosed.  3rd RPLB lesion, 100 %stenosed.  Dist LAD-2 lesion, 90 %stenosed.  Lat 1st Diag lesion, 99 %stenosed.  1st Diag lesion, 90 %stenosed.  Ost 1st Mrg to 1st Mrg lesion, 70 %stenosed.  Ost 3rd Mrg to 3rd Mrg lesion, 70 %stenosed.  Dist LAD-1 lesion, 90 %stenosed.  Post intervention, there is a 30% residual stenosis.  Prox LAD lesion, 100 %stenosed.  Post intervention, there is a 0% residual stenosis.  A STENT SYNERGY DES 2.5X38 drug eluting stent was successfully placed.  There is moderate left ventricular systolic dysfunction.  The left ventricular ejection fraction is 35-45% by visual estimate.  LV end diastolic pressure is normal.   1.  3 vessel obstructive disease with diffuse CAD. Culprit lesion is occlusion of the mid LAD 2. Moderate LV dysfunction with normal LVEDP 3. Successful stenting of the mid LAD with DES. The distal LAD is still diffusely  diseased.   Plan: DAPT for at least one year. Aggressive risk factor modification.  ASSESSMENT AND PLAN:  Principal Problem:   Acute ST elevation myocardial infarction (STEMI) involving left anterior descending (LAD) coronary artery (HCC) S/p stent to LAD, on BB, asa, Brilinta and coumadin, if INR therapeutic will stop ASA. con't statin and recheck hepatic and lipids in 6 weeks.  CAD multivessel, aggressive therapy  35-40% con't as above recheck labs.  Unable to increase meds at this time.    ICM with EF recheck echo in 3 months.      Hyperlipidemia continue statin    Diabetes mellitus type 2, uncontrolled ([place]) pt aware of importance to continue     Essential hypertension  Controlled.  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  She brought papers to fill out to return to work.  Will discuss with Dr.Varanasi on this.  Follow up with him in 8 weeks.  Current medicines are reviewed with the patient today.  The patient Has no concerns regarding medicines.  The following changes have been made:  See above Labs/ tests ordered today include:see above  Disposition:   FU:  see above  Signed, Cecilie Kicks, NP  10/09/2015 9:13 AM    [address], [place], [place] [employer] [place], [place] Phone: [phone]; Fax: [phone]

## 2015-10-09 ENCOUNTER — Ambulatory Visit (INDEPENDENT_AMBULATORY_CARE_PROVIDER_SITE_OTHER): Payer: 59 | Admitting: Cardiology

## 2015-10-09 ENCOUNTER — Encounter: Payer: 59 | Admitting: Physician Assistant

## 2015-10-09 ENCOUNTER — Ambulatory Visit (INDEPENDENT_AMBULATORY_CARE_PROVIDER_SITE_OTHER): Payer: 59 | Admitting: *Deleted

## 2015-10-09 ENCOUNTER — Encounter: Payer: Self-pay | Admitting: Cardiology

## 2015-10-09 VITALS — BP 112/70 | HR 91 | Ht 64.0 in | Wt 169.8 lb

## 2015-10-09 DIAGNOSIS — I213 ST elevation (STEMI) myocardial infarction of unspecified site: Secondary | ICD-10-CM

## 2015-10-09 DIAGNOSIS — Z7901 Long term (current) use of anticoagulants: Secondary | ICD-10-CM

## 2015-10-09 DIAGNOSIS — I2102 ST elevation (STEMI) myocardial infarction involving left anterior descending coronary artery: Secondary | ICD-10-CM | POA: Diagnosis not present

## 2015-10-09 DIAGNOSIS — I10 Essential (primary) hypertension: Secondary | ICD-10-CM | POA: Diagnosis not present

## 2015-10-09 DIAGNOSIS — I513 Intracardiac thrombosis, not elsewhere classified: Secondary | ICD-10-CM

## 2015-10-09 DIAGNOSIS — E785 Hyperlipidemia, unspecified: Secondary | ICD-10-CM | POA: Diagnosis not present

## 2015-10-09 DIAGNOSIS — I2510 Atherosclerotic heart disease of native coronary artery without angina pectoris: Secondary | ICD-10-CM

## 2015-10-09 LAB — BASIC METABOLIC PANEL
BUN: 12 mg/dL (ref 7–25)
CO2: 30 mmol/L (ref 20–31)
CREATININE: 0.67 mg/dL (ref 0.50–0.99)
Calcium: 9.4 mg/dL (ref 8.6–10.4)
Chloride: 104 mmol/L (ref 98–110)
Glucose, Bld: 193 mg/dL — ABNORMAL HIGH (ref 65–99)
POTASSIUM: 4.3 mmol/L (ref 3.5–5.3)
Sodium: 141 mmol/L (ref 135–146)

## 2015-10-09 LAB — MAGNESIUM: MAGNESIUM: 2.1 mg/dL (ref 1.5–2.5)

## 2015-10-09 LAB — POCT INR: INR: 2.7

## 2015-10-09 NOTE — Patient Instructions (Signed)
Medication Instructions:  If INR is therapeutic today, you may STOP ASPIRIN. Gay Filler will give you these instructions.  Labwork: TODAY: BMET, Magnesium  FASTING LABS IN 6 WEEKS: LFTs, Lipids  Testing/Procedures: None  Follow-Up: Your physician recommends that you schedule a follow-up appointment in 8 WEEKS with Dr. Irish Lack.  Any Other Special Instructions Will Be Listed Below (If Applicable).     If you need a refill on your cardiac medications before your next appointment, please call your pharmacy.

## 2015-10-12 ENCOUNTER — Telehealth: Payer: Self-pay | Admitting: Interventional Cardiology

## 2015-10-12 DIAGNOSIS — R002 Palpitations: Secondary | ICD-10-CM

## 2015-10-12 MED ORDER — CARVEDILOL 3.125 MG PO TABS: ORAL_TABLET | ORAL | 6 refills | Status: DC

## 2015-10-12 NOTE — Telephone Encounter (Signed)
New message    Pt verbalized that she has a fast heart beat after working out in the evenings. She is not having any issues currently, she don't feel like she can return to work just yet  Please call pt, pt was a little tearful over the phone

## 2015-10-12 NOTE — Telephone Encounter (Signed)
Ok she is not yet cleared to return to work, need to check with Dr. Irish Lack next week.  He is not here this week.    Would like her to come in for EKG to see if we can capture irregular HR.  I know she has PVCs but just want to make sure no a fib.  And we should do 48 hour holter monitor.    We could increase the coreg to 3.125 in AM and 2 of the 3.125 at night but to watch BP - it was [id number] systolic in the office.

## 2015-10-12 NOTE — Telephone Encounter (Signed)
Patient complaining of irregular HR for the last couple of days in the evening after walking. Patient stated her O2 was normal, and her HR 80's. Patient denies chest pain or SOB. Patient stated she is not sure that she can return to work at this time. Patient is also requesting nitroglycerin prescription. Patient stated she normally has PVC's but this is worse. Patient wanted Cecilie Kicks NP consulted since she saw her on 10/09/15. Will forward to Cecilie Kicks NP for advisement.

## 2015-10-12 NOTE — Telephone Encounter (Signed)
Called patient about Shawna Kicks NP's response. Patient stated her irregular heart rate is usually in the evening, so she will give the office a call if it happens during office hours. Ordered heart monitor and increased dose of Coreg. Will have [facility] call to schedule for patient. Patient verbalized understanding.

## 2015-10-13 ENCOUNTER — Other Ambulatory Visit: Payer: Self-pay | Admitting: Neurology

## 2015-10-13 NOTE — Telephone Encounter (Signed)
Chronic insomnia, needs to see psychologist .

## 2015-10-13 NOTE — Telephone Encounter (Signed)
This pt was taking ambien prior to being seen.  Looks like from last note you wanted psych/sleep psychologist  to handle chronic insomnia. You made reccs on how to take ambien.   Did you want to refill?

## 2015-10-17 ENCOUNTER — Ambulatory Visit (INDEPENDENT_AMBULATORY_CARE_PROVIDER_SITE_OTHER): Payer: 59

## 2015-10-17 DIAGNOSIS — I213 ST elevation (STEMI) myocardial infarction of unspecified site: Secondary | ICD-10-CM

## 2015-10-17 DIAGNOSIS — R002 Palpitations: Secondary | ICD-10-CM | POA: Diagnosis not present

## 2015-10-17 DIAGNOSIS — Z7901 Long term (current) use of anticoagulants: Secondary | ICD-10-CM | POA: Diagnosis not present

## 2015-10-17 DIAGNOSIS — I513 Intracardiac thrombosis, not elsewhere classified: Secondary | ICD-10-CM

## 2015-10-17 LAB — POCT INR: INR: 3.5

## 2015-10-24 ENCOUNTER — Ambulatory Visit (INDEPENDENT_AMBULATORY_CARE_PROVIDER_SITE_OTHER): Payer: 59 | Admitting: *Deleted

## 2015-10-24 DIAGNOSIS — I213 ST elevation (STEMI) myocardial infarction of unspecified site: Secondary | ICD-10-CM

## 2015-10-24 DIAGNOSIS — Z7901 Long term (current) use of anticoagulants: Secondary | ICD-10-CM

## 2015-10-24 DIAGNOSIS — I513 Intracardiac thrombosis, not elsewhere classified: Secondary | ICD-10-CM

## 2015-10-24 LAB — POCT INR: INR: 3.2

## 2015-10-24 MED ORDER — WARFARIN SODIUM 5 MG PO TABS: 7.5000 mg | ORAL_TABLET | Freq: Every day | ORAL | 2 refills | Status: DC

## 2015-10-31 ENCOUNTER — Ambulatory Visit (INDEPENDENT_AMBULATORY_CARE_PROVIDER_SITE_OTHER): Payer: 59 | Admitting: Pharmacist

## 2015-10-31 DIAGNOSIS — Z7901 Long term (current) use of anticoagulants: Secondary | ICD-10-CM | POA: Diagnosis not present

## 2015-10-31 DIAGNOSIS — I213 ST elevation (STEMI) myocardial infarction of unspecified site: Secondary | ICD-10-CM | POA: Diagnosis not present

## 2015-10-31 DIAGNOSIS — I513 Intracardiac thrombosis, not elsewhere classified: Secondary | ICD-10-CM

## 2015-10-31 LAB — POCT INR: INR: 2.2

## 2015-11-02 ENCOUNTER — Telehealth: Payer: Self-pay | Admitting: Neurology

## 2015-11-02 DIAGNOSIS — G4730 Sleep apnea, unspecified: Principal | ICD-10-CM

## 2015-11-02 DIAGNOSIS — G4710 Hypersomnia, unspecified: Secondary | ICD-10-CM

## 2015-11-02 NOTE — Telephone Encounter (Signed)
Per insurance HST only- . CD

## 2015-11-02 NOTE — Telephone Encounter (Signed)
[employer] denied Split sleep study.  Peer to Peer [phone]  Do you want me to see if I can get an authorization for a HST.  If so, please put an order in.

## 2015-11-07 ENCOUNTER — Encounter: Payer: 59 | Admitting: Interventional Cardiology

## 2015-11-13 ENCOUNTER — Ambulatory Visit (INDEPENDENT_AMBULATORY_CARE_PROVIDER_SITE_OTHER): Payer: 59 | Admitting: *Deleted

## 2015-11-13 DIAGNOSIS — Z7901 Long term (current) use of anticoagulants: Secondary | ICD-10-CM | POA: Diagnosis not present

## 2015-11-13 DIAGNOSIS — I513 Intracardiac thrombosis, not elsewhere classified: Secondary | ICD-10-CM

## 2015-11-13 LAB — POCT INR: INR: 3.2

## 2015-11-14 ENCOUNTER — Other Ambulatory Visit: Payer: Self-pay

## 2015-11-14 DIAGNOSIS — R682 Dry mouth, unspecified: Secondary | ICD-10-CM

## 2015-11-14 DIAGNOSIS — R351 Nocturia: Secondary | ICD-10-CM

## 2015-11-14 DIAGNOSIS — G4726 Circadian rhythm sleep disorder, shift work type: Secondary | ICD-10-CM

## 2015-11-14 MED ORDER — TRAZODONE HCL 100 MG PO TABS: 100.0000 mg | ORAL_TABLET | Freq: Every day | ORAL | 1 refills | Status: DC

## 2015-11-14 NOTE — Telephone Encounter (Signed)
RX for trazodone faxed to [facility]. Received a receipt of confirmation.

## 2015-11-20 ENCOUNTER — Other Ambulatory Visit: Payer: 59 | Admitting: *Deleted

## 2015-11-20 DIAGNOSIS — E785 Hyperlipidemia, unspecified: Secondary | ICD-10-CM

## 2015-11-20 LAB — HEPATIC FUNCTION PANEL
ALBUMIN: 4 g/dL (ref 3.6–5.1)
ALK PHOS: 57 U/L (ref 33–130)
ALT: 20 U/L (ref 6–29)
AST: 19 U/L (ref 10–35)
Bilirubin, Direct: 0.1 mg/dL (ref <=0.2)
Indirect Bilirubin: 0.6 mg/dL (ref 0.2–1.2)
TOTAL PROTEIN: 6.5 g/dL (ref 6.1–8.1)
Total Bilirubin: 0.7 mg/dL (ref 0.2–1.2)

## 2015-11-20 LAB — LIPID PANEL
Cholesterol: 221 mg/dL — ABNORMAL HIGH (ref 125–200)
HDL: 52 mg/dL (ref >=46)
LDL Cholesterol: 122 mg/dL (ref <130)
TRIGLYCERIDES: 235 mg/dL — AB (ref <150)
Total CHOL/HDL Ratio: 4.3 Ratio (ref <=5.0)
VLDL: 47 mg/dL — ABNORMAL HIGH (ref <30)

## 2015-12-01 ENCOUNTER — Ambulatory Visit (INDEPENDENT_AMBULATORY_CARE_PROVIDER_SITE_OTHER): Payer: 59 | Admitting: *Deleted

## 2015-12-01 DIAGNOSIS — I513 Intracardiac thrombosis, not elsewhere classified: Secondary | ICD-10-CM

## 2015-12-01 DIAGNOSIS — Z7901 Long term (current) use of anticoagulants: Secondary | ICD-10-CM

## 2015-12-01 LAB — POCT INR: INR: 1.4

## 2015-12-06 ENCOUNTER — Encounter (INDEPENDENT_AMBULATORY_CARE_PROVIDER_SITE_OTHER): Payer: Self-pay

## 2015-12-06 ENCOUNTER — Ambulatory Visit: Payer: 59 | Admitting: Interventional Cardiology

## 2015-12-06 ENCOUNTER — Ambulatory Visit (INDEPENDENT_AMBULATORY_CARE_PROVIDER_SITE_OTHER): Payer: 59 | Admitting: Cardiology

## 2015-12-06 ENCOUNTER — Encounter: Payer: Self-pay | Admitting: Cardiology

## 2015-12-06 ENCOUNTER — Ambulatory Visit (INDEPENDENT_AMBULATORY_CARE_PROVIDER_SITE_OTHER): Payer: 59 | Admitting: *Deleted

## 2015-12-06 VITALS — BP 126/70 | HR 75 | Ht 64.0 in | Wt 187.8 lb

## 2015-12-06 DIAGNOSIS — R002 Palpitations: Secondary | ICD-10-CM | POA: Diagnosis not present

## 2015-12-06 DIAGNOSIS — Z7901 Long term (current) use of anticoagulants: Secondary | ICD-10-CM | POA: Diagnosis not present

## 2015-12-06 DIAGNOSIS — I10 Essential (primary) hypertension: Secondary | ICD-10-CM

## 2015-12-06 DIAGNOSIS — I513 Intracardiac thrombosis, not elsewhere classified: Secondary | ICD-10-CM

## 2015-12-06 LAB — BASIC METABOLIC PANEL
BUN: 9 mg/dL (ref 7–25)
CHLORIDE: 102 mmol/L (ref 98–110)
CO2: 28 mmol/L (ref 20–31)
Calcium: 8.9 mg/dL (ref 8.6–10.4)
Creat: 0.66 mg/dL (ref 0.50–0.99)
GLUCOSE: 202 mg/dL — AB (ref 65–99)
POTASSIUM: 4.1 mmol/L (ref 3.5–5.3)
SODIUM: 139 mmol/L (ref 135–146)

## 2015-12-06 LAB — POCT INR: INR: 4.2

## 2015-12-06 MED ORDER — NITROGLYCERIN 0.4 MG SL SUBL: 0.4000 mg | SUBLINGUAL_TABLET | SUBLINGUAL | 3 refills | Status: AC | PRN

## 2015-12-06 MED ORDER — LISINOPRIL 2.5 MG PO TABS: 2.5000 mg | ORAL_TABLET | Freq: Every day | ORAL | 3 refills | Status: DC

## 2015-12-06 MED ORDER — ROSUVASTATIN CALCIUM 20 MG PO TABS: 20.0000 mg | ORAL_TABLET | Freq: Every day | ORAL | 3 refills | Status: DC

## 2015-12-06 MED ORDER — TICAGRELOR 90 MG PO TABS: 90.0000 mg | ORAL_TABLET | Freq: Two times a day (BID) | ORAL | 10 refills | Status: DC

## 2015-12-06 MED ORDER — CARVEDILOL 3.125 MG PO TABS: ORAL_TABLET | ORAL | 6 refills | Status: DC

## 2015-12-06 NOTE — Progress Notes (Signed)
12/06/2015 Shawna Sparks   [birth year]  [id number]  Primary Physician Donnajean Lopes, MD Primary Cardiologist: Dr. Martinique   Reason for Visit/CC: F/u for CAD and Chronic Systolic HF  HPI:  The patient is a 65 y/o female, who presents to clinic for f/u for CAD and chronic systolic HF.   She was admitted 09/24/15 with acute STEMI secondary to an occluded LAD. She underwent successful PCI + DES. Echocardiogram revealed reduced ejection fraction at 35-40% with akinesis of the mid apical anterior septal inferoseptal, inferoapical apical, anterior apical and apical myocardium. There was also concern for possible apical thrombus on her echo. Subsequently she was placed on Coumadin for anticoagulation. She was also placed on aspirin and Brilinta. Her ASA was discontinued after 2 weeks. She was also placed on medical therapy with carvedilol. At time of hospital discharge, her blood pressure did not allow for initiation of an ace/ARB. She also has a history of insulin-dependent diabetes, hypertension and hyperlipidemia  She was seen in clinic on 10/09/2015 for post hospital follow-up. She was doing well without any recurrent anginal symptoms. She was continued on medical therapy. Again an ACE inhibitor/ARB was not initiated due to soft blood pressure. She was instructed to follow-up in 8 weeks.   She presents back to clinic today for follow-up. She has done well. She denies any significant recurrence of anginal symptoms. No dyspnea. She is euvolemic on physical exam. No lower extremity edema. She has been fully compliant with all of her medications. She remains on  Brilinta and Coumadin. Her INRs are followed in our Coumadin clinic. She denies any abnormal bleeding. She does report that she had to self discontinue her Lipitor due to intolerance with arthralgias/myalgias.  Current Meds  Medication Sig  . carvedilol (COREG) 3.125 MG tablet Take one tablet in the AM and take two tablets in the PM  .  citalopram (CELEXA) 40 MG tablet Take 20 mg by mouth daily.   . fluconazole (DIFLUCAN) 150 MG tablet 150 mg (1 tablet) daily as needed for yeast  . fluocinonide (LIDEX) 0.05 % external solution Apply 1 application topically at bedtime as needed (for scaly itchy skiin).   Marland Kitchen LANTUS SOLOSTAR 100 UNIT/ML Solostar Pen Inject 20-30 Units into the skin daily at 10 pm. Don't let sugar get below 200; will bottom pt out  . NOVOLOG 100 UNIT/ML injection Take 20 units three times a day with meals as needed per sliding scale  . ticagrelor (BRILINTA) 90 MG TABS tablet Take 1 tablet (90 mg total) by mouth 2 (two) times daily.  . traZODone (DESYREL) 100 MG tablet Take 1 tablet (100 mg total) by mouth at bedtime.  . valACYclovir (VALTREX) 500 MG tablet Take 1,000 mg by mouth 2 (two) times daily as needed (for outbreaks).   . vitamin B-12 (CYANOCOBALAMIN) 1000 MCG tablet Take 1,000 mcg by mouth daily.  . Vitamin D, Ergocalciferol, (DRISDOL) 50000 UNITS CAPS capsule Take 1 capsule (50,000 units) every Friday  . VYVANSE 50 MG capsule Take 50 mg by mouth at bedtime as needed (for shift changes).   . warfarin (COUMADIN) 5 MG tablet Take 1.5 tablets (7.5 mg total) by mouth daily. Or as directed by coumadin Clinic  . zolpidem (AMBIEN) 10 MG tablet Take 10 mg by mouth at bedtime as needed for sleep.  . [DISCONTINUED] carvedilol (COREG) 3.125 MG tablet Take one tablet in the AM and take two tablets in the PM  . [DISCONTINUED] ticagrelor (BRILINTA) 90 MG TABS tablet Take 1  tablet (90 mg total) by mouth 2 (two) times daily.   Allergies  Allergen Reactions  . Codeine Shortness Of Breath  . Benadryl [Diphenhydramine Hcl (Sleep)] Itching  . Dilaudid [Hydromorphone Hcl] Rash  . Flexeril [Cyclobenzaprine] Hives, Itching and Rash   Past Medical History:  Diagnosis Date  . Adrenal adenoma right   accidental finding 6 yrs ago-   . Apical mural thrombus    a. potential thrombus by echo in 09/2015. Started on Coumadin.  .  Arthritis    knees  . Attention deficit disorder (ADD) t  . CAD (coronary artery disease)    a. STEMI 09/2015: 3v disease with 100% stenosis of mid-LAD (DES placed). Management of risk factors recommended for her diffuse disease.  . Depression   . Diabetes mellitus    a. on Insulin, A1c 11.7 in 09/2015  . High cholesterol   . Insomnia   . Ureteral stone left  . Urinary incontinence    Family History  Problem Relation Age of Onset  . Congestive Heart Failure Mother   . Pancreatic cancer Father    Past Surgical History:  Procedure Laterality Date  . CARDIAC CATHETERIZATION  2000   wnl  . CARDIAC CATHETERIZATION N/A 09/20/2015   Procedure: Left Heart Cath and Coronary Angiography;  Surgeon: Peter M Martinique, MD;  Location: [place] [facility];  Service: Cardiovascular;  Laterality: N/A;  . CARDIAC CATHETERIZATION N/A 09/20/2015   Procedure: Coronary Stent Intervention;  Surgeon: Peter M Martinique, MD;  Location: [place] [facility];  Service: Cardiovascular;  Laterality: N/A;  . CHOLECYSTECTOMY  1973  . CYSTOSCOPY/RETROGRADE/URETEROSCOPY  12/27/2010   Procedure: CYSTOSCOPY/RETROGRADE/URETEROSCOPY;  Surgeon: Malka So;  Location: [facility];  Service: Urology;  Laterality: N/A;  cystoscopy left retrograde ureterocopy stone exstraction poss holmium laser  . GASTRIC BYPASS  2004  . LUMBAR DISC SURGERY    . LUMBAR FUSION  1997   L 4 - 5  . LUMBAR MICRODISCECTOMY  1996   L4 - 5  . VAGINAL HYSTERECTOMY  1996   Social History   Social History  . Marital status: Divorced    Spouse name: N/A  . Number of children: 3  . Years of education: MSN   Occupational History  . nurse    Social History Main Topics  . Smoking status: Never Smoker  . Smokeless tobacco: Never Used  . Alcohol use 0.0 oz/week     Comment: 8-12 oz a day  . Drug use: No  . Sexual activity: Not on file   Other Topics Concern  . Not on file   Social History Narrative   Drinks caffeine only when  working nights.      Review of Systems: General: negative for chills, fever, night sweats or weight changes.  Cardiovascular: negative for chest pain, dyspnea on exertion, edema, orthopnea, palpitations, paroxysmal nocturnal dyspnea or shortness of breath Dermatological: negative for rash Respiratory: negative for cough or wheezing Urologic: negative for hematuria Abdominal: negative for nausea, vomiting, diarrhea, bright red blood per rectum, melena, or hematemesis Neurologic: negative for visual changes, syncope, or dizziness All other systems reviewed and are otherwise negative except as noted above.   Physical Exam:  Blood pressure 126/70, pulse 75, height 5\' 4"  (1.626 m), weight 187 lb 12.8 oz (85.2 kg), SpO2 96 %.  General appearance: alert, cooperative and no distress Neck: no carotid bruit and no JVD Lungs: clear to auscultation bilaterally Heart: regular rate and rhythm, S1, S2 normal, no murmur, click,  rub or gallop Extremities: extremities normal, atraumatic, no cyanosis or edema Pulses: 2+ and symmetric Skin: Skin color, texture, turgor normal. No rashes or lesions Neurologic: Grossly normal  EKG not preformed   ASSESSMENT AND PLAN:   1. CAD: s/p STEMI 2/2 LAD occlusion. S/p PCI +DES. She is stable w/o anginal symptoms. Continue Brilinta. No ASA given Coumadin + Brilinta use. Continue BB. We will add low dose ACE-I given LV dysfunction and DM. Change statin from Lipitor to Crestor given intolerance to Lipitor. Phase 2 cardiac rehab. Will need a repeat echo in several weeks to reassess LVF.   2. Chronic Systolic HF: EF post cath was 35-40%. She is euvolemic. No dyspnea nor LEE. Continue BB therapy with Coreg. BP is now stable to add low dose ACE-I, 2.5 mg of lisinopril daily. We will check a BMP today for baseline assessment of renal function and K. Repeat BMP in 1 week after starting lisinopril. Low sodium diet and daily weights.   3. LA atrial Thrombus: on Coumadin for  a/c. INRs are followed in our Coumadin Clinic. No abnormal bleeding.   4. IDDM: followed by PCP but patient notes she has not had a recent Hgb A1c. Will order today. Add ACE-I for LV dysfunction, CAD and renoprotection.   5. HLD: intolerant to Lipitor due to myalgias. Will try Crestor, 20 mg. Hopefully she can tolerate a different statin. If not, will need referral to Lipid clinic for PCSK9 inhibitors. Repeat FLP in 4 weeks.   PLAN  F/u with Dr. Martinique or APP in 3 weeks for further medication titration  Lyda Jester PA-C 12/06/2015 3:26 PM

## 2015-12-06 NOTE — Patient Instructions (Addendum)
Medication Instructions:  Your physician has recommended you make the following change in your medication:  1.  START Lisinopril 2.5 mg taking 1 tablet daily 2.  START Crestor 20 mg taking 1 tablet daily  We refilled the following medications today: Brilinta Nitroglycerin  Labwork: TODAY:  BMET 1 WEEK:  BMET  Testing/Procedures: None ordered  Follow-Up: Your physician wants you to follow-up in: 3 WEEKS WITH DR. Martinique OR AN EXTENDER ON HIS CARE TEAM   Any Other Special Instructions Will Be Listed Below (If Applicable).   If you need a refill on your cardiac medications before your next appointment, please call your pharmacy.

## 2015-12-08 ENCOUNTER — Telehealth: Payer: Self-pay | Admitting: Cardiology

## 2015-12-08 DIAGNOSIS — E785 Hyperlipidemia, unspecified: Secondary | ICD-10-CM

## 2015-12-08 DIAGNOSIS — I10 Essential (primary) hypertension: Secondary | ICD-10-CM

## 2015-12-08 NOTE — Telephone Encounter (Signed)
Pt returned my call and we discussed her lab results. She verbalized understanding.

## 2015-12-08 NOTE — Telephone Encounter (Signed)
Returned pts call re: lab results. lmptcb jw 12/08/15

## 2015-12-08 NOTE — Telephone Encounter (Signed)
Pt is returning your phone call  

## 2015-12-11 NOTE — Addendum Note (Signed)
Addended by: Gaetano Net on: 12/11/2015 11:51 AM   Modules accepted: Orders

## 2015-12-18 ENCOUNTER — Other Ambulatory Visit: Payer: 59

## 2015-12-19 ENCOUNTER — Ambulatory Visit (INDEPENDENT_AMBULATORY_CARE_PROVIDER_SITE_OTHER): Payer: 59 | Admitting: *Deleted

## 2015-12-19 ENCOUNTER — Other Ambulatory Visit: Payer: 59 | Admitting: *Deleted

## 2015-12-19 DIAGNOSIS — I513 Intracardiac thrombosis, not elsewhere classified: Secondary | ICD-10-CM | POA: Diagnosis not present

## 2015-12-19 DIAGNOSIS — I10 Essential (primary) hypertension: Secondary | ICD-10-CM

## 2015-12-19 DIAGNOSIS — E785 Hyperlipidemia, unspecified: Secondary | ICD-10-CM

## 2015-12-19 DIAGNOSIS — Z7901 Long term (current) use of anticoagulants: Secondary | ICD-10-CM | POA: Diagnosis not present

## 2015-12-19 DIAGNOSIS — R002 Palpitations: Secondary | ICD-10-CM

## 2015-12-19 LAB — POCT INR: INR: 2.1

## 2015-12-20 LAB — BASIC METABOLIC PANEL
BUN: 9 mg/dL (ref 7–25)
CALCIUM: 8.8 mg/dL (ref 8.6–10.4)
CHLORIDE: 103 mmol/L (ref 98–110)
CO2: 22 mmol/L (ref 20–31)
CREATININE: 0.91 mg/dL (ref 0.50–0.99)
GLUCOSE: 283 mg/dL — AB (ref 65–99)
Potassium: 4.3 mmol/L (ref 3.5–5.3)
Sodium: 136 mmol/L (ref 135–146)

## 2015-12-20 LAB — HEMOGLOBIN A1C
Hgb A1c MFr Bld: 9 % — ABNORMAL HIGH (ref <5.7)
Mean Plasma Glucose: 212 mg/dL

## 2015-12-28 ENCOUNTER — Ambulatory Visit (INDEPENDENT_AMBULATORY_CARE_PROVIDER_SITE_OTHER): Payer: 59 | Admitting: Nurse Practitioner

## 2015-12-28 ENCOUNTER — Encounter: Payer: Self-pay | Admitting: Nurse Practitioner

## 2015-12-28 VITALS — BP 114/68 | HR 83 | Ht 64.0 in | Wt 190.4 lb

## 2015-12-28 DIAGNOSIS — E119 Type 2 diabetes mellitus without complications: Secondary | ICD-10-CM | POA: Diagnosis not present

## 2015-12-28 DIAGNOSIS — R0602 Shortness of breath: Secondary | ICD-10-CM | POA: Diagnosis not present

## 2015-12-28 DIAGNOSIS — I2129 ST elevation (STEMI) myocardial infarction involving other sites: Secondary | ICD-10-CM | POA: Diagnosis not present

## 2015-12-28 DIAGNOSIS — I2510 Atherosclerotic heart disease of native coronary artery without angina pectoris: Secondary | ICD-10-CM

## 2015-12-28 DIAGNOSIS — I513 Intracardiac thrombosis, not elsewhere classified: Secondary | ICD-10-CM

## 2015-12-28 DIAGNOSIS — I236 Thrombosis of atrium, auricular appendage, and ventricle as current complications following acute myocardial infarction: Secondary | ICD-10-CM | POA: Insufficient documentation

## 2015-12-28 DIAGNOSIS — I5022 Chronic systolic (congestive) heart failure: Secondary | ICD-10-CM | POA: Diagnosis not present

## 2015-12-28 NOTE — Progress Notes (Signed)
Office Visit    Patient Name: Shawna Sparks Date of Encounter: 12/28/2015  Primary Care Provider:  Donnajean Lopes, MD Primary Cardiologist:  P. Martinique, MD   Chief Complaint    65 year old female status post anterior STEMI in August 2017, with history of diabetes, hyperlipidemia, ischemic cardiomyopathy, chronic systolic CHF, and LV apical thrombus who presents for follow-up.  Past Medical History    Past Medical History:  Diagnosis Date  . Adrenal adenoma right   accidental finding 6 yrs ago-   . Arthritis    knees  . Attention deficit disorder (ADD) t  . CAD (coronary artery disease)    a. 09/2015 Ant STEMI/PCI: LM nl, LAD 100p (2.5x38 Synergy DES - 3.0), 90d (PTCA), D1 90, lat branch 99, LCX nl, OM1 70, OM3 70, RCA 20p, RPLB3 100.  Marland Kitchen Chronic systolic CHF (congestive heart failure) ([place])    a. 09/2015 Echo: EF 35-40%, Gr1 DD.  Marland Kitchen Depression   . Diabetes mellitus    a. on Insulin, A1c 11.7 in 09/2015  . High cholesterol    a. myalgias w/ lipitor - tolerating crestor.  . Insomnia   . Ischemic cardiomyopathy    a. 09/2015 Echo: EF 35-40%, mid-apical anteroseptal, infsept, infap, antap, and apical AK, Gr1 DD, ? apical thrombus, triv MR, mild TR.  . LV (left ventricular) mural thrombus following MI ([place])    a. Dx 09/2015 following Ant STEMI-->coumadin.  Marland Kitchen Ureteral stone left  . Urinary incontinence    Past Surgical History:  Procedure Laterality Date  . CARDIAC CATHETERIZATION  2000   wnl  . CARDIAC CATHETERIZATION N/A 09/20/2015   Procedure: Left Heart Cath and Coronary Angiography;  Surgeon: Peter M Martinique, MD;  Location: [facility];  Service: Cardiovascular;  Laterality: N/A;  . CARDIAC CATHETERIZATION N/A 09/20/2015   Procedure: Coronary Stent Intervention;  Surgeon: Peter M Martinique, MD;  Location: [place] [facility];  Service: Cardiovascular;  Laterality: N/A;  . CHOLECYSTECTOMY  1973  . CYSTOSCOPY/RETROGRADE/URETEROSCOPY  12/27/2010   Procedure:  CYSTOSCOPY/RETROGRADE/URETEROSCOPY;  Surgeon: Malka So;  Location: [facility];  Service: Urology;  Laterality: N/A;  cystoscopy left retrograde ureterocopy stone exstraction poss holmium laser  . GASTRIC BYPASS  2004  . LUMBAR DISC SURGERY    . LUMBAR FUSION  1997   L 4 - 5  . LUMBAR MICRODISCECTOMY  1996   L4 - 5  . VAGINAL HYSTERECTOMY  1996    Allergies  Allergies  Allergen Reactions  . Codeine Shortness Of Breath  . Benadryl [Diphenhydramine Hcl (Sleep)] Itching  . Dilaudid [Hydromorphone Hcl] Rash  . Flexeril [Cyclobenzaprine] Hives, Itching and Rash    History of Present Illness    65 year old female with the above, plus past medical history including coronary artery disease status post anterior MI in August 2017 requiring stenting of the proximal LAD and PTCA of the distal LAD. She also had small vessel disease including the diagonal branch, OM1, OM 3, and RPL B3. EF was 35-40%. She was last seen on October 25, at which time she was doing reasonably well. Today however she reports that she has been having some dyspnea on exertion over the past few months. She senses that some of this is secondary to deconditioning as she has not been as active since her MI in August as she was prior to that. She no longer works and does not routinely exercise. She has not been having any chest pain. Her weight is up  nearly 30 pounds since August though she has not had any evidence of volume overload or edema. She denies PND, orthopnea, dizziness, syncope, palpitations, or early satiety. She is compliant with her medications. She is interested in partaking in cardiac rehabilitation but has not signed up yet.  Home Medications    Prior to Admission medications   Medication Sig Start Date End Date Taking? Authorizing Provider  carvedilol (COREG) 3.125 MG tablet Take one tablet in the AM and take two tablets in the PM 12/06/15   Jettie Booze, MD  citalopram (CELEXA) 40 MG  tablet Take 20 mg by mouth daily.  08/14/15   Historical Provider, MD  fluconazole (DIFLUCAN) 150 MG tablet 150 mg (1 tablet) daily as needed for yeast 11/11/14   Historical Provider, MD  fluocinonide (LIDEX) 0.05 % external solution Apply 1 application topically at bedtime as needed (for scaly itchy skiin).  08/14/15   Historical Provider, MD  LANTUS SOLOSTAR 100 UNIT/ML Solostar Pen Inject 20-30 Units into the skin daily at 10 pm. Don't let sugar get below 200; will bottom pt out 08/14/15   Historical Provider, MD  lisinopril (PRINIVIL,ZESTRIL) 2.5 MG tablet Take 1 tablet (2.5 mg total) by mouth daily. 12/06/15 03/05/16  Jettie Booze, MD  nitroGLYCERIN (NITROSTAT) 0.4 MG SL tablet Place 1 tablet (0.4 mg total) under the tongue every 5 (five) minutes as needed for chest pain. 12/06/15 03/05/16  Brittainy M Simmons, PA-C  NOVOLOG 100 UNIT/ML injection Take 20 units three times a day with meals as needed per sliding scale 11/11/14   Historical Provider, MD  rosuvastatin (CRESTOR) 20 MG tablet Take 1 tablet (20 mg total) by mouth daily. 12/06/15 03/05/16  Jettie Booze, MD  ticagrelor (BRILINTA) 90 MG TABS tablet Take 1 tablet (90 mg total) by mouth 2 (two) times daily. 12/06/15   Jettie Booze, MD  traZODone (DESYREL) 100 MG tablet Take 1 tablet (100 mg total) by mouth at bedtime. 11/14/15   Asencion Partridge Dohmeier, MD  valACYclovir (VALTREX) 500 MG tablet Take 1,000 mg by mouth 2 (two) times daily as needed (for outbreaks).  08/14/15   Historical Provider, MD  vitamin B-12 (CYANOCOBALAMIN) 1000 MCG tablet Take 1,000 mcg by mouth daily.    Historical Provider, MD  Vitamin D, Ergocalciferol, (DRISDOL) 50000 UNITS CAPS capsule Take 1 capsule (50,000 units) every Friday 09/07/14   Historical Provider, MD  VYVANSE 50 MG capsule Take 50 mg by mouth at bedtime as needed (for shift changes).  08/24/15   Historical Provider, MD  warfarin (COUMADIN) 5 MG tablet Take 1.5 tablets (7.5 mg total) by mouth daily. Or as  directed by coumadin Clinic 10/24/15   Jettie Booze, MD  zolpidem (AMBIEN) 10 MG tablet Take 10 mg by mouth at bedtime as needed for sleep.    Historical Provider, MD    Review of Systems    She has been expressing dyspnea on exertion over the past few months. She did not mention this at her last visit. She denies chest pain, palpitations, PND, orthopnea, dizziness, syncope, edema, or early satiety.  All other systems reviewed and are otherwise negative except as noted above.  Physical Exam    VS:  BP 114/68   Pulse 83   Ht 5\' 4"  (1.626 m)   Wt 190 lb 6.4 oz (86.4 kg)   BMI 32.68 kg/m  , BMI Body mass index is 32.68 kg/m. GEN: Well nourished, well developed, in no acute distress.  HEENT: normal.  Neck:  Supple, no JVD, carotid bruits, or masses. Cardiac: RRR, no murmurs, rubs, or gallops. No clubbing, cyanosis, edema.  Radials/DP/PT 2+ and equal bilaterally.  Respiratory:  Respirations regular and unlabored, clear to auscultation bilaterally. GI: Soft, nontender, nondistended, BS + x 4. MS: no deformity or atrophy. Skin: warm and dry, no rash. Neuro:  Strength and sensation are intact. Psych: Normal affect.  Accessory Clinical Findings    ECG - Regular sinus rhythm, 85, left axis, prior inferior and anterolateral infarcts. No acute changes.  Assessment & Plan    1.  Coronary artery disease: Patient has not been having any chest pain. She does report dyspnea on exertion that has been present for several months but has progressed some. She is not very active and she has gained 30 pounds. She thinks some of this is secondary to deconditioning. She has not had any evidence of volume overload. She's been tolerating her medicines and has been compliant as well. She remains on beta blocker, ACE inhibitor, statin, and Brilinta. She is not on aspirin because she is currently taking warfarin in the setting of an LV apical thrombus. She is interested in cardiac rehabilitation. As it has  been 3 months since her PCI and event, I will plan to follow-up echocardiogram to reevaluate LV function which was previously 35-40%. I will plan to see her back within the next 2-3 weeks following echo. If echo shows stable LV function or improvement in LV function and she continues to have dyspnea on exertion, I will plan to proceed with stress testing. She has known small vessel disease and we did discuss adding a long-acting nitrate therapy, however she is not interested at this time.  2. Ischemic cardiomyopathy/chronic systolic congestive heart failure: EF 3540% by echo following PCI. As above, I will repeat this. She has had dyspnea but is euvolemic. I suspect some of this dyspnea may either be secondary to deconditioning or ischemia in the setting of known small vessel disease. Continue beta blocker and ACE inhibitor therapy. She is tolerating both.  3. Left ventricular apical thrombus: She remains on Coumadin. Follow-up echo to reassess wall motion and evaluate for thrombus.  4. Insulin dependent diabetes mellitus: She says she is noncompliant. A1c has come down to percentage points from 11-9. She realizes they're still working on. She remains on ACE inhibitor and statin therapy.  5. Hyperlipidemia: She was intolerant to Lipitor but is tolerating Crestor. LDL was 122 and October.  6. Disposition: Follow-up echo with office follow-up in the next few weeks to readdress symptoms and discuss echo.   Murray Hodgkins, NP 12/28/2015, 12:51 PM

## 2015-12-28 NOTE — Patient Instructions (Signed)
Your physician has requested that you have an echocardiogram. Echocardiography is a painless test that uses sound waves to create images of your heart. It provides your doctor with information about the size and shape of your heart and how well your heart's chambers and valves are working. This procedure takes approximately one hour. There are no restrictions for this procedure. This will be performed at our [facility] location - [address], [address].  Ignacia Bayley, NP recommends that you schedule a follow-up appointment 1 week after echocardiogram.

## 2016-01-02 ENCOUNTER — Ambulatory Visit: Payer: 59

## 2016-01-13 ENCOUNTER — Other Ambulatory Visit: Payer: Self-pay | Admitting: Neurology

## 2016-01-13 DIAGNOSIS — R351 Nocturia: Secondary | ICD-10-CM

## 2016-01-13 DIAGNOSIS — R682 Dry mouth, unspecified: Secondary | ICD-10-CM

## 2016-01-13 DIAGNOSIS — G4726 Circadian rhythm sleep disorder, shift work type: Secondary | ICD-10-CM

## 2016-01-15 ENCOUNTER — Ambulatory Visit (INDEPENDENT_AMBULATORY_CARE_PROVIDER_SITE_OTHER): Payer: 59 | Admitting: Pharmacist Clinician (PhC)/ Clinical Pharmacy Specialist

## 2016-01-15 DIAGNOSIS — I2102 ST elevation (STEMI) myocardial infarction involving left anterior descending coronary artery: Secondary | ICD-10-CM | POA: Diagnosis not present

## 2016-01-15 DIAGNOSIS — I513 Intracardiac thrombosis, not elsewhere classified: Secondary | ICD-10-CM

## 2016-01-15 LAB — POCT INR: INR: 1.8

## 2016-01-19 ENCOUNTER — Ambulatory Visit (HOSPITAL_COMMUNITY): Payer: 59 | Attending: Internal Medicine

## 2016-01-19 DIAGNOSIS — E785 Hyperlipidemia, unspecified: Secondary | ICD-10-CM | POA: Insufficient documentation

## 2016-01-19 DIAGNOSIS — I340 Nonrheumatic mitral (valve) insufficiency: Secondary | ICD-10-CM | POA: Diagnosis not present

## 2016-01-19 DIAGNOSIS — I252 Old myocardial infarction: Secondary | ICD-10-CM | POA: Insufficient documentation

## 2016-01-19 DIAGNOSIS — R000 Tachycardia, unspecified: Secondary | ICD-10-CM | POA: Insufficient documentation

## 2016-01-19 DIAGNOSIS — E119 Type 2 diabetes mellitus without complications: Secondary | ICD-10-CM | POA: Diagnosis not present

## 2016-01-19 DIAGNOSIS — R0602 Shortness of breath: Secondary | ICD-10-CM | POA: Insufficient documentation

## 2016-01-19 DIAGNOSIS — I509 Heart failure, unspecified: Secondary | ICD-10-CM | POA: Diagnosis not present

## 2016-01-19 DIAGNOSIS — I454 Nonspecific intraventricular block: Secondary | ICD-10-CM | POA: Insufficient documentation

## 2016-01-19 DIAGNOSIS — I110 Hypertensive heart disease with heart failure: Secondary | ICD-10-CM | POA: Diagnosis not present

## 2016-01-19 DIAGNOSIS — I071 Rheumatic tricuspid insufficiency: Secondary | ICD-10-CM | POA: Diagnosis not present

## 2016-01-19 DIAGNOSIS — I513 Intracardiac thrombosis, not elsewhere classified: Secondary | ICD-10-CM | POA: Diagnosis not present

## 2016-01-19 MED ORDER — PERFLUTREN LIPID MICROSPHERE
1.0000 mL | INTRAVENOUS | Status: AC | PRN
  Administered 2016-01-19: 2 mL via INTRAVENOUS

## 2016-01-24 ENCOUNTER — Ambulatory Visit (INDEPENDENT_AMBULATORY_CARE_PROVIDER_SITE_OTHER): Payer: 59 | Admitting: Nurse Practitioner

## 2016-01-24 ENCOUNTER — Encounter: Payer: Self-pay | Admitting: Nurse Practitioner

## 2016-01-24 ENCOUNTER — Ambulatory Visit (INDEPENDENT_AMBULATORY_CARE_PROVIDER_SITE_OTHER): Payer: 59 | Admitting: Pharmacist

## 2016-01-24 VITALS — BP 139/88 | HR 80 | Ht 64.0 in | Wt 195.0 lb

## 2016-01-24 DIAGNOSIS — I2102 ST elevation (STEMI) myocardial infarction involving left anterior descending coronary artery: Secondary | ICD-10-CM | POA: Diagnosis not present

## 2016-01-24 DIAGNOSIS — E782 Mixed hyperlipidemia: Secondary | ICD-10-CM | POA: Diagnosis not present

## 2016-01-24 DIAGNOSIS — I2129 ST elevation (STEMI) myocardial infarction involving other sites: Secondary | ICD-10-CM

## 2016-01-24 DIAGNOSIS — I5042 Chronic combined systolic (congestive) and diastolic (congestive) heart failure: Secondary | ICD-10-CM | POA: Diagnosis not present

## 2016-01-24 DIAGNOSIS — I25118 Atherosclerotic heart disease of native coronary artery with other forms of angina pectoris: Secondary | ICD-10-CM

## 2016-01-24 DIAGNOSIS — I255 Ischemic cardiomyopathy: Secondary | ICD-10-CM

## 2016-01-24 DIAGNOSIS — I236 Thrombosis of atrium, auricular appendage, and ventricle as current complications following acute myocardial infarction: Secondary | ICD-10-CM

## 2016-01-24 DIAGNOSIS — I513 Intracardiac thrombosis, not elsewhere classified: Secondary | ICD-10-CM

## 2016-01-24 LAB — CBC
HEMATOCRIT: 47.7 % — AB (ref 35.0–45.0)
Hemoglobin: 16.5 g/dL — ABNORMAL HIGH (ref 11.7–15.5)
MCH: 30.4 pg (ref 27.0–33.0)
MCHC: 34.6 g/dL (ref 32.0–36.0)
MCV: 88 fL (ref 80.0–100.0)
MPV: 10.3 fL (ref 7.5–12.5)
PLATELETS: 279 10*3/uL (ref 140–400)
RBC: 5.42 MIL/uL — AB (ref 3.80–5.10)
RDW: 15.2 % — ABNORMAL HIGH (ref 11.0–15.0)
WBC: 7.6 10*3/uL (ref 3.8–10.8)

## 2016-01-24 LAB — BASIC METABOLIC PANEL
BUN: 14 mg/dL (ref 7–25)
CALCIUM: 9.6 mg/dL (ref 8.6–10.4)
CHLORIDE: 99 mmol/L (ref 98–110)
CO2: 26 mmol/L (ref 20–31)
CREATININE: 0.73 mg/dL (ref 0.50–0.99)
GLUCOSE: 202 mg/dL — AB (ref 65–99)
Potassium: 4.4 mmol/L (ref 3.5–5.3)
Sodium: 138 mmol/L (ref 135–146)

## 2016-01-24 LAB — POCT INR: INR: 1.7

## 2016-01-24 MED ORDER — ISOSORBIDE MONONITRATE ER 30 MG PO TB24: 15.0000 mg | ORAL_TABLET | Freq: Every day | ORAL | 11 refills | Status: DC

## 2016-01-24 NOTE — Patient Instructions (Signed)
Medication Instructions:  START- Isosorbide 15 mg daily  Labwork: BNP, CBC, BMP   Testing/Procedures: Your physician has requested that you have a lexiscan myoview. For further information please visit [email or web address]. Please follow instruction sheet, as given.   Follow-Up: Your physician recommends that you schedule a follow-up appointment in: 1 Month with Ignacia Bayley   Any Other Special Instructions Will Be Listed Below (If Applicable).   If you need a refill on your cardiac medications before your next appointment, please call your pharmacy.

## 2016-01-24 NOTE — Progress Notes (Signed)
Office Visit    Patient Name: Shawna Sparks Date of Encounter: 01/24/2016  Primary Care Provider:  Donnajean Lopes, MD Primary Cardiologist:  P. Martinique, MD   Chief Complaint    65 year old female status post anterior STEMI in August 2017, with history of diabetes, hyperlipidemia, ischemic cardiomyopathy, chronic systolic CHF, and LV apical thrombus who presents for follow-up r/t ongoing DOE.  Past Medical History    Past Medical History:  Diagnosis Date  . Adrenal adenoma right   accidental finding 6 yrs ago-   . Arthritis    knees  . Attention deficit disorder (ADD) t  . CAD (coronary artery disease)    a. 09/2015 Ant STEMI/PCI: LM nl, LAD 100p (2.5x38 Synergy DES - 3.0), 90d (PTCA), D1 90, lat branch 99, LCX nl, OM1 70, OM3 70, RCA 20p, RPLB3 100.  Marland Kitchen Chronic combined systolic and diastolic CHF (congestive heart failure) ([place])    a. 09/2015 Echo: EF 35-40%, Gr1 DD;  b. 01/19/2016 Echo: EF 45%, distal inf/infseptal, apical AK, Gr1 DD.  Marland Kitchen Depression   . Diabetes mellitus    a. on Insulin, A1c 11.7 in 09/2015  . High cholesterol    a. myalgias w/ lipitor - tolerating crestor.  . Insomnia   . Ischemic cardiomyopathy    a. 09/2015 Echo: EF 35-40%, mid-apical anteroseptal, infsept, infap, antap, and apical AK, Gr1 DD, ? apical thrombus, triv MR, mild TR.  . LV (left ventricular) mural thrombus following MI ([place])    a. Dx 09/2015 following Ant STEMI-->coumadin;  b. 01/2016 Echo: Ef 45%, no LV thrombus.  . Ureteral stone left  . Urinary incontinence    Past Surgical History:  Procedure Laterality Date  . CARDIAC CATHETERIZATION  2000   wnl  . CARDIAC CATHETERIZATION N/A 09/20/2015   Procedure: Left Heart Cath and Coronary Angiography;  Surgeon: Peter M Martinique, MD;  Location: [place] [facility];  Service: Cardiovascular;  Laterality: N/A;  . CARDIAC CATHETERIZATION N/A 09/20/2015   Procedure: Coronary Stent Intervention;  Surgeon: Peter M Martinique, MD;  Location: [facility] [facility];  Service: Cardiovascular;  Laterality: N/A;  . CHOLECYSTECTOMY  1973  . CYSTOSCOPY/RETROGRADE/URETEROSCOPY  12/27/2010   Procedure: CYSTOSCOPY/RETROGRADE/URETEROSCOPY;  Surgeon: Malka So;  Location: [place] [facility];  Service: Urology;  Laterality: N/A;  cystoscopy left retrograde ureterocopy stone exstraction poss holmium laser  . GASTRIC BYPASS  2004  . LUMBAR DISC SURGERY    . LUMBAR FUSION  1997   L 4 - 5  . LUMBAR MICRODISCECTOMY  1996   L4 - 5  . VAGINAL HYSTERECTOMY  1996    Allergies  Allergies  Allergen Reactions  . Codeine Shortness Of Breath  . Benadryl [Diphenhydramine Hcl (Sleep)] Itching  . Dilaudid [Hydromorphone Hcl] Rash  . Flexeril [Cyclobenzaprine] Hives, Itching and Rash    History of Present Illness    65 year old female with the above, complex past medical history including coronary artery disease status post anterior MI in August 2017 requiring stenting of the proximal LAD and PTCA of the distal LAD. She also had small vessel disease including the diagonal branch, OM1, OM 3, and RPL B3. EF was 35-40%.  She was dong well @ her f/u visit on 10/25, however I saw her on 11/16 and she reported DOE w/o evidence of volume overload.  She suspected that she was deconditioned and noted that she had gained 30 lbs since her MI in 09/2015.  Echo on 12/8 showed stable LV fxn w/  an EF of 45%, distal inferior/inferoseptal, and apical AK, and grade 1 DD.  LV thrombus was not noted.  Since her last visit, she has continued to note DOE.  She does not have exertional chest pain but does occasionally note brief/fleeting episodes of c/p @ night.  She also had a 15 minute episodeof left arm pain the other night, for which she took a sl ntg w/o immediate effect. The pain eventually resolved.  She has been more attuned to her caloric intake though remains sedentary.  In that setting, wt is up four more pounds.  She denies pnd, orthopnea, n, v, dizziness, syncope, edema, or  early satiety, but does feel that her abdomen is tighter than usual.  Home Medications    Prior to Admission medications   Medication Sig Start Date End Date Taking? Authorizing Provider  carvedilol (COREG) 3.125 MG tablet Take one tablet in the AM and take two tablets in the PM 12/06/15   Jettie Booze, MD  citalopram (CELEXA) 40 MG tablet Take 20 mg by mouth daily.  08/14/15   Historical Provider, MD  fluconazole (DIFLUCAN) 150 MG tablet 150 mg (1 tablet) daily as needed for yeast 11/11/14   Historical Provider, MD  fluocinonide (LIDEX) 0.05 % external solution Apply 1 application topically at bedtime as needed (for scaly itchy skiin).  08/14/15   Historical Provider, MD  LANTUS SOLOSTAR 100 UNIT/ML Solostar Pen Inject 30 Units into the skin daily at 10 pm.  08/14/15   Historical Provider, MD  lisinopril (PRINIVIL,ZESTRIL) 2.5 MG tablet Take 1 tablet (2.5 mg total) by mouth daily. 12/06/15 03/05/16  Jettie Booze, MD  nitroGLYCERIN (NITROSTAT) 0.4 MG SL tablet Place 1 tablet (0.4 mg total) under the tongue every 5 (five) minutes as needed for chest pain. 12/06/15 03/05/16  Brittainy M Simmons, PA-C  NOVOLOG 100 UNIT/ML injection per sliding scale 11/11/14   Historical Provider, MD  rosuvastatin (CRESTOR) 20 MG tablet Take 1 tablet (20 mg total) by mouth daily. 12/06/15 03/05/16  Jettie Booze, MD  ticagrelor (BRILINTA) 90 MG TABS tablet Take 1 tablet (90 mg total) by mouth 2 (two) times daily. 12/06/15   Jettie Booze, MD  traZODone (DESYREL) 100 MG tablet TAKE 1 TABLET BY MOUTH AT BEDTIME 01/15/16   Larey Seat, MD  valACYclovir (VALTREX) 500 MG tablet Take 1,000 mg by mouth 2 (two) times daily as needed (for outbreaks).  08/14/15   Historical Provider, MD  vitamin B-12 (CYANOCOBALAMIN) 1000 MCG tablet Take 1,000 mcg by mouth daily.    Historical Provider, MD  Vitamin D, Ergocalciferol, (DRISDOL) 50000 UNITS CAPS capsule Take 1 capsule (50,000 units) every Friday 09/07/14    Historical Provider, MD  warfarin (COUMADIN) 5 MG tablet Take 1.5 tablets (7.5 mg total) by mouth daily. Or as directed by coumadin Clinic 10/24/15   Jettie Booze, MD    Review of Systems    DOE, increase abd girth, occas, fleeting chest pain @ rest, left arm pain the other night.  She denies  palpitations, pnd, orthopnea, n, v, dizziness, syncope, edema, or early satiety.   All other systems reviewed and are otherwise negative except as noted above.  Physical Exam    VS:  BP 139/88   Pulse 80   Ht 5\' 4"  (1.626 m)   Wt 195 lb (88.5 kg)   SpO2 95%   BMI 33.47 kg/m  , BMI Body mass index is 33.47 kg/m. GEN: Well nourished, well developed, in no acute distress.  HEENT: normal.  Neck: Supple, difficult to gauge JVP 2/2 body habitus.  No carotid bruits, or masses. Cardiac: RRR, no murmurs, rubs, or gallops. No clubbing, cyanosis, edema.  Radials/DP/PT 2+ and equal bilaterally.  Respiratory:  Respirations regular and unlabored, clear to auscultation bilaterally. GI: Soft, nontender, nondistended, BS + x 4. MS: no deformity or atrophy. Skin: warm and dry, no rash. Neuro:  Strength and sensation are intact. Psych: Normal affect.  Accessory Clinical Findings    BMET, CBC, BNP, Lexiscan MV pending.  Echo from earlier this month reviewed in detail with patient.  Assessment & Plan    1.  Coronary artery disease: S/p Anterior MI in August with DES to the LAD and PTCA to the distal LAD.  Ms. Kohen has been having DOE over the past two months.  She primarily attributes this to being more sedentary and putting on 30 lbs since her MI in August.  I saw her a few wks ago and arranged for an echo, which showed slightly improved LV fxn with an EF of 35-40%.  She continues to have DOE.  She has not had exertional chest pain but did have an episode of left arm pain the other night that lasted about 15 mins.  She did take ntg for it, but it did not immediately relieve the pain.  As was  previously planned, given stable LV fxn/dysfxn on echo, I will arrange for a lexiscan MV to assess her ischemic burden.  She has known small vessel dzs involving the D1, OM1, OM3, and RPLB3.  In that setting, she is now willing to accept a Rx for Imdur and I will add today (15 mg daily for starters).  She prefers to have her nuc study done after the new year.  She otw remains on  blocker, acei, statin, and brilinta.  She is not on ASA b/c she is on coumadin.  She will hold off on enrollment in cardiac rehab until after her stress test is completed.  2. Ischemic cardiomyopathy/chronic combined systolic and diastolic congestive heart failure: EF 35-40% by echo following PCI - now 45% by echo earlier this month.  Cont  blocker and acei.  As above, she has been having DOE and wt gain.  She denies pnd/orthopnea/edema, but does feel an increase in abd girth.  Her abd is soft on exam.  JVP is difficult to gauge due to body habitus.  Lungs are clear.  I will check a cbc, bmet, and bnp today.  If bnp elevated, I will add oral lasix.  3. Left ventricular apical thrombus: Recent echo did not show LV thrombus.  She has been on coumadin for the past four months.  This can likely be d/c'd. I will d/w Dr. Martinique.  Would plan to start asa 81 once off of coumadin in light of recent PCI.  4. Insulin dependent diabetes mellitus:  Paying more attention to calories/carbs.  A1c has come down 2 percentage points from 11 to 9 in November. She remains on ACE inhibitor and statin therapy.  5. Hyperlipidemia: She was intolerant to Lipitor but is tolerating Crestor. LDL was 122 in October.  This will require f/u and I would have a low threshold to add zetia and subsequently PCSK9 inhibitor therapy if not @ goal.  6. Disposition: f/u labs today and MV as outlined above.   Murray Hodgkins, NP 01/24/2016, 2:45 PM

## 2016-01-25 LAB — BRAIN NATRIURETIC PEPTIDE: Brain Natriuretic Peptide: 120.1 pg/mL — ABNORMAL HIGH (ref <100)

## 2016-01-26 ENCOUNTER — Other Ambulatory Visit: Payer: Self-pay | Admitting: *Deleted

## 2016-01-26 MED ORDER — FUROSEMIDE 20 MG PO TABS: 20.0000 mg | ORAL_TABLET | Freq: Every day | ORAL | 0 refills | Status: DC

## 2016-01-29 ENCOUNTER — Telehealth: Payer: Self-pay | Admitting: Cardiology

## 2016-01-29 NOTE — Telephone Encounter (Signed)
Called patient regarding notes below. Explained med changes - she voiced understanding. Med list updated.  Patient reports she is SOB, no weight loss since starting lasix. Thinks imdur is causing her to be dizzy, nauseous.   Advised I will send a message to Dr. Cristela Blue, NP for advice. Patient was quick to get off phone so no other info was able to be obtained.

## 2016-01-29 NOTE — Telephone Encounter (Signed)
Patient notified of NP advice. She is appreciative of his attentiveness to her concerns. She is agreeable to moving stress test sooner if able. Will route to [place] for assistance.

## 2016-01-29 NOTE — Telephone Encounter (Signed)
-----   Message from Rogelia Mire, NP sent at 01/29/2016 12:32 PM EST ----- Shawna Sparks,  I spoke with Dr. Martinique re: Shawna Sparks's coumadin.  He is ok with her stopping it now that she is 4 months out since LV thrombus post MI and no further thrombus was noted on recent echo.  Could you please contact her and let her know and also ask that she begin ASA 81 mg daily, while continuing to take Brilinta?  Thanks,  Meryle Ready - just an [facility] for you.

## 2016-01-29 NOTE — Telephone Encounter (Signed)
The lasix was only Rx for three days since bnp was very mildly elevated.  She may stop imdur if not tolerating.  F/u myoview as planned - looks like it is not scheduled until 1/9.  Is there any way to move it up?

## 2016-02-06 ENCOUNTER — Telehealth: Payer: Self-pay | Admitting: Cardiology

## 2016-02-06 DIAGNOSIS — Z79899 Other long term (current) drug therapy: Secondary | ICD-10-CM

## 2016-02-06 MED ORDER — FUROSEMIDE 20 MG PO TABS: 20.0000 mg | ORAL_TABLET | Freq: Every day | ORAL | 3 refills | Status: DC

## 2016-02-06 NOTE — Telephone Encounter (Signed)
Yes, she may take lasix 20 mg daily.  BMET in a week.

## 2016-02-06 NOTE — Telephone Encounter (Signed)
Pt did a trial on Lasix,it was doing good for her shortness of breath. She is not on it now, pt is very short of breath.

## 2016-02-06 NOTE — Telephone Encounter (Signed)
Patient advised on recommendations and voiced understanding. At her request, BMET order set up for [facility] lab next Tuesday (sched appt and order linked) and her Rx was called to preferred local pharmacy for pickup. Patient aware to call if she has new questions, symptoms, or concerns.

## 2016-02-06 NOTE — Telephone Encounter (Signed)
Spoke to patient. Notes she'd been fairly reluctant to use lasix but had done so at [facility]' instruction following recent [place] (see results on 12/13). She did a 3 day course of lasix 20mg  and noted marked improvement of her symptoms. She has felt short of breath for several days, ever since discontinuing lasix (was only prescribed 3 days worth of med). Symptoms are identifical to what she had prior to use of lasix. Wanted to know if just better to start this med daily. I note she has the upcoming myoview on 1/9 and OV f/u on 1/23.  Pt aware I will check w provider, see if agreeable to prescribe this out for daily use, or if other intervention recommended.

## 2016-02-13 ENCOUNTER — Other Ambulatory Visit: Payer: 59

## 2016-02-14 ENCOUNTER — Ambulatory Visit: Payer: Self-pay | Admitting: Pharmacist

## 2016-02-14 DIAGNOSIS — I2102 ST elevation (STEMI) myocardial infarction involving left anterior descending coronary artery: Secondary | ICD-10-CM

## 2016-02-14 DIAGNOSIS — I513 Intracardiac thrombosis, not elsewhere classified: Secondary | ICD-10-CM

## 2016-02-16 ENCOUNTER — Telehealth (HOSPITAL_COMMUNITY): Payer: Self-pay

## 2016-02-16 NOTE — Telephone Encounter (Signed)
Encounter complete. 

## 2016-02-20 ENCOUNTER — Ambulatory Visit (HOSPITAL_COMMUNITY)
Admission: RE | Admit: 2016-02-20 | Discharge: 2016-02-20 | Disposition: A | Discharge status: Home / Self Care | Payer: Medicare Other | Source: Ambulatory Visit | Attending: Nurse Practitioner | Admitting: Nurse Practitioner

## 2016-02-20 DIAGNOSIS — Z794 Long term (current) use of insulin: Secondary | ICD-10-CM | POA: Insufficient documentation

## 2016-02-20 DIAGNOSIS — I252 Old myocardial infarction: Secondary | ICD-10-CM | POA: Insufficient documentation

## 2016-02-20 DIAGNOSIS — E119 Type 2 diabetes mellitus without complications: Secondary | ICD-10-CM | POA: Insufficient documentation

## 2016-02-20 DIAGNOSIS — I25118 Atherosclerotic heart disease of native coronary artery with other forms of angina pectoris: Secondary | ICD-10-CM | POA: Diagnosis not present

## 2016-02-20 DIAGNOSIS — I255 Ischemic cardiomyopathy: Secondary | ICD-10-CM | POA: Insufficient documentation

## 2016-02-20 DIAGNOSIS — I509 Heart failure, unspecified: Secondary | ICD-10-CM | POA: Diagnosis not present

## 2016-02-20 DIAGNOSIS — I510 Cardiac septal defect, acquired: Secondary | ICD-10-CM | POA: Insufficient documentation

## 2016-02-20 DIAGNOSIS — I110 Hypertensive heart disease with heart failure: Secondary | ICD-10-CM | POA: Diagnosis not present

## 2016-02-20 LAB — MYOCARDIAL PERFUSION IMAGING
CHL CUP NUCLEAR SDS: 18
CHL CUP NUCLEAR SSS: 26
CHL CUP RESTING HR STRESS: 78 {beats}/min
CHL CUP STRESS STAGE 1 DBP: 62 mmHg
CHL CUP STRESS STAGE 3 GRADE: 0 %
CHL CUP STRESS STAGE 3 HR: 94 {beats}/min
CHL CUP STRESS STAGE 4 HR: 83 {beats}/min
CHL CUP STRESS STAGE 4 SBP: 132 mmHg
CHL CUP STRESS STAGE 4 SPEED: 0 mph
CSEPEW: 1 METS
CSEPPBP: 139 mmHg
CSEPPHR: 94 {beats}/min
CSEPPMHR: 60 %
LV sys vol: 41 mL
LVDIAVOL: 75 mL (ref 46–106)
SRS: 8
Stage 1 Grade: 0 %
Stage 1 HR: 78 {beats}/min
Stage 1 SBP: 100 mmHg
Stage 1 Speed: 0 mph
Stage 2 Grade: 0 %
Stage 2 HR: 78 {beats}/min
Stage 2 Speed: 0 mph
Stage 3 DBP: 60 mmHg
Stage 3 SBP: 139 mmHg
Stage 3 Speed: 0 mph
Stage 4 DBP: 91 mmHg
Stage 4 Grade: 0 %
TID: 1.08

## 2016-02-20 MED ORDER — TECHNETIUM TC 99M TETROFOSMIN IV KIT
31.8000 | PACK | Freq: Once | INTRAVENOUS | Status: AC | PRN
  Administered 2016-02-20: 31.8 via INTRAVENOUS
  Filled 2016-02-20: qty 32

## 2016-02-20 MED ORDER — REGADENOSON 0.4 MG/5ML IV SOLN
0.4000 mg | Freq: Once | INTRAVENOUS | Status: AC
  Administered 2016-02-20: 0.4 mg via INTRAVENOUS

## 2016-02-20 MED ORDER — TECHNETIUM TC 99M TETROFOSMIN IV KIT
10.1000 | PACK | Freq: Once | INTRAVENOUS | Status: AC | PRN
  Administered 2016-02-20: 10.1 via INTRAVENOUS
  Filled 2016-02-20: qty 11

## 2016-02-20 MED ORDER — AMINOPHYLLINE 25 MG/ML IV SOLN
75.0000 mg | Freq: Once | INTRAVENOUS | Status: AC
  Administered 2016-02-20: 75 mg via INTRAVENOUS

## 2016-02-29 ENCOUNTER — Ambulatory Visit: Payer: 59 | Admitting: Nurse Practitioner

## 2016-03-06 ENCOUNTER — Ambulatory Visit (INDEPENDENT_AMBULATORY_CARE_PROVIDER_SITE_OTHER): Payer: Medicare Other | Admitting: Nurse Practitioner

## 2016-03-06 ENCOUNTER — Encounter: Payer: Self-pay | Admitting: Nurse Practitioner

## 2016-03-06 VITALS — BP 154/96 | HR 87 | Ht 64.0 in | Wt 203.2 lb

## 2016-03-06 DIAGNOSIS — I25118 Atherosclerotic heart disease of native coronary artery with other forms of angina pectoris: Secondary | ICD-10-CM

## 2016-03-06 DIAGNOSIS — E782 Mixed hyperlipidemia: Secondary | ICD-10-CM | POA: Diagnosis not present

## 2016-03-06 DIAGNOSIS — I5022 Chronic systolic (congestive) heart failure: Secondary | ICD-10-CM

## 2016-03-06 DIAGNOSIS — I255 Ischemic cardiomyopathy: Secondary | ICD-10-CM | POA: Diagnosis not present

## 2016-03-06 DIAGNOSIS — I10 Essential (primary) hypertension: Secondary | ICD-10-CM

## 2016-03-06 DIAGNOSIS — I209 Angina pectoris, unspecified: Secondary | ICD-10-CM

## 2016-03-06 MED ORDER — FUROSEMIDE 20 MG PO TABS: 20.0000 mg | ORAL_TABLET | Freq: Every day | ORAL | 0 refills | Status: DC

## 2016-03-06 NOTE — Patient Instructions (Signed)
Medication Instructions:  Your physician recommends that you continue on your current medications as directed. Please refer to the Current Medication list given to you today.   Labwork: None ordered  Testing/Procedures: None ordered  Follow-Up: Your physician recommends that you schedule a follow-up appointment in: 3 MONTHS WITH DR. Martinique   Any Other Special Instructions Will Be Listed Below (If Applicable).     If you need a refill on your cardiac medications before your next appointment, please call your pharmacy.

## 2016-03-06 NOTE — Progress Notes (Signed)
Office Visit    Patient Name: Shawna Sparks Date of Encounter: 03/06/2016  Primary Care Provider:  Donnajean Lopes, MD Primary Cardiologist:  P. Martinique, MD   Chief Complaint    66 year old female status post anterior STEMI in August 2017, with history of diabetes, hyperlipidemia, ischemic cardiomyopathy, chronic systolic CHF, and LV apical thrombus who presents for follow-up after recent stress testing.  Past Medical History    Past Medical History:  Diagnosis Date  . Adrenal adenoma right   accidental finding 6 yrs ago-   . Arthritis    knees  . Attention deficit disorder (ADD) t  . CAD (coronary artery disease)    a. 09/2015 Ant STEMI/PCI: LM nl, LAD 100p (2.5x38 Synergy DES - 3.0), 90d (PTCA), D1 90, lat branch 99, LCX nl, OM1 70, OM3 70, RCA 20p, RPLB3 100;  b. 02/2016 Lexi MV: Ef 45%, large mid ant/antsept/inf/apical ant/apical septa/apical inf/apical lateral/apical defect with partial reversibility.  . Chronic combined systolic and diastolic CHF (congestive heart failure) ([place])    a. 09/2015 Echo: EF 35-40%, Gr1 DD;  b. 01/19/2016 Echo: EF 45%, distal inf/infseptal, apical AK, Gr1 DD.  Marland Kitchen Depression   . Diabetes mellitus    a. on Insulin, A1c 11.7 in 09/2015  . High cholesterol    a. myalgias w/ lipitor - tolerating crestor.  . Insomnia   . Ischemic cardiomyopathy    a. 09/2015 Echo: EF 35-40%, mid-apical anteroseptal, infsept, infap, antap, and apical AK, Gr1 DD, ? apical thrombus, triv MR, mild TR.  . LV (left ventricular) mural thrombus following MI ([place])    a. Dx 09/2015 following Ant STEMI-->coumadin;  b. 01/2016 Echo: Ef 45%, no LV thrombus.  . Ureteral stone left  . Urinary incontinence    Past Surgical History:  Procedure Laterality Date  . CARDIAC CATHETERIZATION  2000   wnl  . CARDIAC CATHETERIZATION N/A 09/20/2015   Procedure: Left Heart Cath and Coronary Angiography;  Surgeon: Peter M Martinique, MD;  Location: [facility];  Service: Cardiovascular;   Laterality: N/A;  . CARDIAC CATHETERIZATION N/A 09/20/2015   Procedure: Coronary Stent Intervention;  Surgeon: Peter M Martinique, MD;  Location: [facility];  Service: Cardiovascular;  Laterality: N/A;  . CHOLECYSTECTOMY  1973  . CYSTOSCOPY/RETROGRADE/URETEROSCOPY  12/27/2010   Procedure: CYSTOSCOPY/RETROGRADE/URETEROSCOPY;  Surgeon: Malka So;  Location: [facility];  Service: Urology;  Laterality: N/A;  cystoscopy left retrograde ureterocopy stone exstraction poss holmium laser  . GASTRIC BYPASS  2004  . LUMBAR DISC SURGERY    . LUMBAR FUSION  1997   L 4 - 5  . LUMBAR MICRODISCECTOMY  1996   L4 - 5  . VAGINAL HYSTERECTOMY  1996    Allergies  Allergies  Allergen Reactions  . Codeine Shortness Of Breath  . Benadryl [Diphenhydramine Hcl (Sleep)] Itching  . Dilaudid [Hydromorphone Hcl] Rash  . Flexeril [Cyclobenzaprine] Hives, Itching and Rash    History of Present Illness    66 year old female with the above, complex past medical history including coronary artery disease status post anterior MI in August 2017 requiring stenting of the proximal LAD and PTCA of the distal LAD. She also had small vessel disease including the diagonal branch, OM1, OM 3, and RPLB3. EF was 35-40%.  I've seen her on a few occasions dating back to mid November.  She was having DOE in the setting of 30 lbs wt gain and deconditioning.  She was not particularly volume overloaded.  Echo  on 12/8 showed stable LV dysfxn with an EF of 45%.  LV thrombus had resolved.  I last saw her 12/13 and she continued to have DOE.  She also reported some atypical/fleeting c/p.  I checked a bnp, which was only mildly elevated and added lasix 20 mg daily, initially only for three days but then b/c of improved exercise tolerance, I went ahead and filled it for longer term.  I also ordered a lexiscan myoview, which she preferred to wait until after the new year to have performed.  Since that last visit, she has been  doing better overall.  She has had significant improvement in exercise tolerance and much less DOE.  She spilled her lasix tabs two days ago and is not due for a refill until Friday.  In that setting, she says that she can tell that she is a little more sob and maybe holding onto a little more fluid.  Her myoview was performed a few wks ago and was abnl with a large area of infarct and mild peri-infarct ischemia.  She has known small vessel dzs.  Since her last visit, she has only had one other episode of brief c/p that resolved spontaneously.  She denies palpitations, pnd, orthopnea, n, v, dizziness, syncope, edema, weight gain, or early satiety.   Home Medications    Prior to Admission medications   Medication Sig Start Date End Date Taking? Authorizing Provider  aspirin EC 81 MG tablet Take 81 mg by mouth daily.   Yes Historical Provider, MD  carvedilol (COREG) 3.125 MG tablet Take one tablet in the AM and take two tablets in the PM 12/06/15  Yes Jettie Booze, MD  citalopram (CELEXA) 40 MG tablet Take 20 mg by mouth daily.  08/14/15  Yes Historical Provider, MD  fluconazole (DIFLUCAN) 150 MG tablet 150 mg (1 tablet) daily as needed for yeast 11/11/14  Yes Historical Provider, MD  fluocinonide (LIDEX) 0.05 % external solution Apply 1 application topically at bedtime as needed (for scaly itchy skiin).  08/14/15  Yes Historical Provider, MD  furosemide (LASIX) 20 MG tablet Take 1 tablet (20 mg total) by mouth daily. 03/06/16 06/04/16 Yes Rogelia Mire, NP  NOVOLOG 100 UNIT/ML injection per sliding scale 11/11/14  Yes Historical Provider, MD  ticagrelor (BRILINTA) 90 MG TABS tablet Take 1 tablet (90 mg total) by mouth 2 (two) times daily. 12/06/15  Yes Jettie Booze, MD  traZODone (DESYREL) 100 MG tablet TAKE 1 TABLET BY MOUTH AT BEDTIME 01/15/16  Yes Larey Seat, MD  valACYclovir (VALTREX) 500 MG tablet Take 1,000 mg by mouth 2 (two) times daily as needed (for outbreaks).  08/14/15  Yes  Historical Provider, MD  vitamin B-12 (CYANOCOBALAMIN) 1000 MCG tablet Take 1,000 mcg by mouth daily.   Yes Historical Provider, MD  Vitamin D, Ergocalciferol, (DRISDOL) 50000 UNITS CAPS capsule Take 1 capsule (50,000 units) every Friday 09/07/14  Yes Historical Provider, MD  lisinopril (PRINIVIL,ZESTRIL) 2.5 MG tablet Take 1 tablet (2.5 mg total) by mouth daily. 12/06/15 03/05/16  Jettie Booze, MD  nitroGLYCERIN (NITROSTAT) 0.4 MG SL tablet Place 1 tablet (0.4 mg total) under the tongue every 5 (five) minutes as needed for chest pain. 12/06/15 03/05/16  Brittainy M Simmons, PA-C  rosuvastatin (CRESTOR) 20 MG tablet Take 1 tablet (20 mg total) by mouth daily. 12/06/15 03/05/16  Jettie Booze, MD    Review of Systems    One episode of fleeting c/p in the last month.  Much  improved doe.  She denies palpitations, pnd, orthopnea, n, v, dizziness, syncope, edema, weight gain, or early satiety.  All other systems reviewed and are otherwise negative except as noted above.  Physical Exam    VS:  BP (!) 154/96 (BP Location: Right Arm, Patient Position: Sitting)   Pulse 87   Ht 5\' 4"  (1.626 m)   Wt 203 lb 3.2 oz (92.2 kg)   BMI 34.88 kg/m  , BMI Body mass index is 34.88 kg/m. Repeat bp 124/80. GEN: Well nourished, well developed, in no acute distress.  HEENT: normal.  Neck: Supple, JVP difficult to assess 2/2 body habitus but no obvious JVD, carotid bruits, or masses. Cardiac: RRR, no murmurs, rubs, or gallops. No clubbing, cyanosis, edema.  Radials/DP/PT 2+ and equal bilaterally.  Respiratory:  Respirations regular and unlabored, clear to auscultation bilaterally. GI: Soft, nontender, nondistended, BS + x 4. MS: no deformity or atrophy. Skin: warm and dry, no rash. Neuro:  Strength and sensation are intact. Psych: Normal affect.  Accessory Clinical Findings    Stress test reviewed in detail with pt.  Assessment & Plan    1.  CAD:  S/p anterior MI 09/2015 with DES to the LAD and  PTCA to the distal LAD.  Though she had been having pretty significant DOE, this has improved greatly with the addition of low dose lasix.  She recently underwent stress testing, which shows a large area of infarct with peri-infarct ischemia.  She has known small vessel dzs involving the D1, OM1, OM3, and RPLB3.  She has not had much in the way of chest pain.  She did have an episode of left arm pain in December and more recently an episode of brief, mild c/p @ rest.  We had a long discussion about her stress test, echo, and symptoms.  In light of improvement in Ss, with known small vessel dzs, possibly accounting for partial reversibility of study, we will continue medical therapy at this time.  We had previously tried imdur but she did not tolerate this.  She remains on  blocker, acei, statin, and brilinta.  She does have prn nitrates.  2.  ICM/Chronic combined systolic and diastolic CHF:  EF [id number] by echo in December.  BNP was very mildly elevated @ that time and I added lasix 20 daily.  With this, she has noted significant improvement in dyspnea.  She is euvolemic on exam today, though she has continued to gain wt since her MI.  She remains on  blocker and acei.  She spilled her lasix tabs two days ago and had to throw them out.  She is not due for a refill until Friday.  I will send in a rx for 2 tabs to cover her for the next two days.  I will also f/u cmet.  3.  LV apical thrombus:  Resolved on echo in December.  Coumadin d/c'd @ that time.  4.  Insulin dependent DM:  Followed by PCP.  Cont acei/statin.  5.  HL:  Previously intolerant to lipitor but tolerating crestor.  She will need f/u lipids and I'll arrange for this.  LDL was 122 in 11/2015.  She may need zetia and/or PCSK9 inhibitor.  6.  Hypertensive Heart Disease: bp initally elevated when she came in, though she was upset with our staff.  I repeated her pressure and it was 124/80.  No change in meds today.  7.  Dispo:  F/u lipids/cmet.   F/u Dr. Martinique in 3 mos  or sooner if necessary.   Murray Hodgkins, NP 03/06/2016, 3:53 PM

## 2016-03-07 ENCOUNTER — Telehealth: Payer: Self-pay | Admitting: *Deleted

## 2016-03-07 NOTE — Telephone Encounter (Signed)
Pt returned my call.  She will come by and have some lab work done before she leaves to go to [place].

## 2016-03-07 NOTE — Telephone Encounter (Signed)
Left a message for pt to call back so I can make her aware that Ignacia Bayley, NP, wants pt to have a CMET and Fasting Lipid before she leaves for [place].

## 2016-03-07 NOTE — Telephone Encounter (Signed)
-----   Message from Jeanann Lewandowsky, [place] sent at 03/06/2016  4:20 PM EST ----- FASTING LIPID & CMET ----- Message ----- From: Jeanann Lewandowsky, RMA Sent: 03/06/2016   4:19 PM To: Jeanann Lewandowsky, RMA  CALL PT TO LET HER KNOW SHE NEEDS FASTING LIPIDS AND LFT'S BEFORE SHE TAKES OFF TO [place]

## 2016-03-27 ENCOUNTER — Other Ambulatory Visit: Payer: Self-pay | Admitting: *Deleted

## 2016-03-27 DIAGNOSIS — I5022 Chronic systolic (congestive) heart failure: Secondary | ICD-10-CM

## 2016-03-27 DIAGNOSIS — I255 Ischemic cardiomyopathy: Secondary | ICD-10-CM

## 2016-03-27 DIAGNOSIS — I10 Essential (primary) hypertension: Secondary | ICD-10-CM

## 2016-03-27 DIAGNOSIS — E782 Mixed hyperlipidemia: Secondary | ICD-10-CM

## 2016-03-27 DIAGNOSIS — I25118 Atherosclerotic heart disease of native coronary artery with other forms of angina pectoris: Secondary | ICD-10-CM

## 2016-03-27 LAB — COMPREHENSIVE METABOLIC PANEL
ALBUMIN: 4.1 g/dL (ref 3.6–5.1)
ALK PHOS: 47 U/L (ref 33–130)
ALT: 19 U/L (ref 6–29)
AST: 17 U/L (ref 10–35)
BILIRUBIN TOTAL: 0.5 mg/dL (ref 0.2–1.2)
BUN: 11 mg/dL (ref 7–25)
CHLORIDE: 102 mmol/L (ref 98–110)
CO2: 29 mmol/L (ref 20–31)
CREATININE: 0.65 mg/dL (ref 0.50–0.99)
Calcium: 9.1 mg/dL (ref 8.6–10.4)
Glucose, Bld: 314 mg/dL — ABNORMAL HIGH (ref 65–99)
Potassium: 4.2 mmol/L (ref 3.5–5.3)
SODIUM: 139 mmol/L (ref 135–146)
TOTAL PROTEIN: 6.3 g/dL (ref 6.1–8.1)

## 2016-03-27 LAB — LIPID PANEL
CHOLESTEROL: 170 mg/dL (ref <200)
HDL: 48 mg/dL — ABNORMAL LOW (ref >50)
LDL Cholesterol: 93 mg/dL (ref <100)
TRIGLYCERIDES: 145 mg/dL (ref <150)
Total CHOL/HDL Ratio: 3.5 Ratio (ref <5.0)
VLDL: 29 mg/dL (ref <30)

## 2016-05-01 ENCOUNTER — Encounter: Payer: Self-pay | Admitting: *Deleted

## 2016-05-12 NOTE — Progress Notes (Signed)
Office Visit    Patient Name: Shawna Sparks Date of Encounter: 05/13/2016  Primary Care Provider:  Donnajean Lopes, MD Primary Cardiologist:  P. Martinique, MD   Chief Complaint    66 year old female status post anterior STEMI in August 2017, with history of diabetes, hyperlipidemia, ischemic cardiomyopathy, chronic systolic CHF, and LV apical thrombus who presents for follow-up.  Past Medical History    Past Medical History:  Diagnosis Date  . Adrenal adenoma right   accidental finding 6 yrs ago-   . Arthritis    knees  . Attention deficit disorder (ADD) t  . CAD (coronary artery disease)    a. 09/2015 Ant STEMI/PCI: LM nl, LAD 100p (2.5x38 Synergy DES - 3.0), 90d (PTCA), D1 90, lat branch 99, LCX nl, OM1 70, OM3 70, RCA 20p, RPLB3 100;  b. 02/2016 Lexi MV: Ef 45%, large mid ant/antsept/inf/apical ant/apical septa/apical inf/apical lateral/apical defect with partial reversibility.  . Chronic combined systolic and diastolic CHF (congestive heart failure) ([place])    a. 09/2015 Echo: EF 35-40%, Gr1 DD;  b. 01/19/2016 Echo: EF 45%, distal inf/infseptal, apical AK, Gr1 DD.  Marland Kitchen Depression   . Diabetes mellitus    a. on Insulin, A1c 11.7 in 09/2015  . High cholesterol    a. myalgias w/ lipitor - tolerating crestor.  . Insomnia   . Ischemic cardiomyopathy    a. 09/2015 Echo: EF 35-40%, mid-apical anteroseptal, infsept, infap, antap, and apical AK, Gr1 DD, ? apical thrombus, triv MR, mild TR.  . LV (left ventricular) mural thrombus following MI ([place])    a. Dx 09/2015 following Ant STEMI-->coumadin;  b. 01/2016 Echo: Ef 45%, no LV thrombus.  . Ureteral stone left  . Urinary incontinence    Past Surgical History:  Procedure Laterality Date  . CARDIAC CATHETERIZATION  2000   wnl  . CARDIAC CATHETERIZATION N/A 09/20/2015   Procedure: Left Heart Cath and Coronary Angiography;  Surgeon: Zoey Bidwell M Martinique, MD;  Location: [facility];  Service: Cardiovascular;  Laterality: N/A;  . CARDIAC  CATHETERIZATION N/A 09/20/2015   Procedure: Coronary Stent Intervention;  Surgeon: Luceal Hollibaugh M Martinique, MD;  Location: [facility];  Service: Cardiovascular;  Laterality: N/A;  . CHOLECYSTECTOMY  1973  . CYSTOSCOPY/RETROGRADE/URETEROSCOPY  12/27/2010   Procedure: CYSTOSCOPY/RETROGRADE/URETEROSCOPY;  Surgeon: Malka So;  Location: [facility];  Service: Urology;  Laterality: N/A;  cystoscopy left retrograde ureterocopy stone exstraction poss holmium laser  . GASTRIC BYPASS  2004  . LUMBAR DISC SURGERY    . LUMBAR FUSION  1997   L 4 - 5  . LUMBAR MICRODISCECTOMY  1996   L4 - 5  . VAGINAL HYSTERECTOMY  1996    Allergies  Allergies  Allergen Reactions  . Codeine Shortness Of Breath  . Benadryl [Diphenhydramine Hcl (Sleep)] Itching  . Dilaudid [Hydromorphone Hcl] Rash  . Flexeril [Cyclobenzaprine] Hives, Itching and Rash    History of Present Illness    66 year old female with the above, complex past medical history including coronary artery disease status post anterior MI in August 2017 requiring stenting of the proximal LAD and PTCA of the distal LAD. She also had small vessel disease including the diagonal branch, OM1, OM 3, and RPLB3. EF was 35-40%.   She had DOE in the setting of 30 lbs wt gain and deconditioning.   Echo on 12/8 showed stable LV dysfxn with an EF of 45%.  LV thrombus had resolved.  When seen 12/13  she continued  to have DOE.  She also reported some atypical/fleeting c/p.  BNP was only mildly elevated and added lasix 20 mg daily with improved exercise tolerance.     A Myoview study  was performed and was abnl with a large area of infarct and mild peri-infarct ischemia.  EF45%. She has known small vessel dzs.    On follow up today she still has some dyspnea on exertion. She states this is better on lasix but not resolved. No edema.  She denies palpitations, pnd, orthopnea, n, v, dizziness, syncope, edema, weight gain, or early satiety. She is very  sedentary. States she is depressed so doesn't get out much. Never did Cardiac Rehab. BS control is poor. Reports she had overnight oximetry which was OK.   Home Medications    Prior to Admission medications   Medication Sig Start Date End Date Taking? Authorizing Provider  aspirin EC 81 MG tablet Take 81 mg by mouth daily.   Yes Historical Provider, MD  carvedilol (COREG) 3.125 MG tablet Take one tablet in the AM and take two tablets in the PM 12/06/15  Yes Jettie Booze, MD  citalopram (CELEXA) 40 MG tablet Take 20 mg by mouth daily.  08/14/15  Yes Historical Provider, MD  fluconazole (DIFLUCAN) 150 MG tablet 150 mg (1 tablet) daily as needed for yeast 11/11/14  Yes Historical Provider, MD  fluocinonide (LIDEX) 0.05 % external solution Apply 1 application topically at bedtime as needed (for scaly itchy skiin).  08/14/15  Yes Historical Provider, MD  furosemide (LASIX) 20 MG tablet Take 1 tablet (20 mg total) by mouth daily. 03/06/16 06/04/16 Yes Rogelia Mire, NP  NOVOLOG 100 UNIT/ML injection per sliding scale 11/11/14  Yes Historical Provider, MD  ticagrelor (BRILINTA) 90 MG TABS tablet Take 1 tablet (90 mg total) by mouth 2 (two) times daily. 12/06/15  Yes Jettie Booze, MD  traZODone (DESYREL) 100 MG tablet TAKE 1 TABLET BY MOUTH AT BEDTIME 01/15/16  Yes Larey Seat, MD  valACYclovir (VALTREX) 500 MG tablet Take 1,000 mg by mouth 2 (two) times daily as needed (for outbreaks).  08/14/15  Yes Historical Provider, MD  vitamin B-12 (CYANOCOBALAMIN) 1000 MCG tablet Take 1,000 mcg by mouth daily.   Yes Historical Provider, MD  Vitamin D, Ergocalciferol, (DRISDOL) 50000 UNITS CAPS capsule Take 1 capsule (50,000 units) every Friday 09/07/14  Yes Historical Provider, MD  lisinopril (PRINIVIL,ZESTRIL) 2.5 MG tablet Take 1 tablet (2.5 mg total) by mouth daily. 12/06/15 03/05/16  Jettie Booze, MD  nitroGLYCERIN (NITROSTAT) 0.4 MG SL tablet Place 1 tablet (0.4 mg total) under the tongue  every 5 (five) minutes as needed for chest pain. 12/06/15 03/05/16  Brittainy M Simmons, PA-C  rosuvastatin (CRESTOR) 20 MG tablet Take 1 tablet (20 mg total) by mouth daily. 12/06/15 03/05/16  Jettie Booze, MD    Review of Systems    As note in HPI.   All other systems reviewed and are otherwise negative except as noted above.  Physical Exam    VS:  BP 120/76   Pulse 92   Ht 5\' 4"  (1.626 m)   Wt 203 lb (92.1 kg)   BMI 34.84 kg/m  , BMI Body mass index is 34.84 kg/m. Repeat bp 124/80. GEN: Well nourished, obese, in no acute distress.  HEENT: normal.  Neck: Supple, JVP difficult to assess 2/2 body habitus but no obvious JVD, carotid bruits, or masses. Cardiac: RRR, no murmurs, rubs, or gallops. No clubbing, cyanosis, edema.  Radials/DP/PT  2+ and equal bilaterally.  Respiratory:  Respirations regular and unlabored, clear to auscultation bilaterally. GI: Soft, nontender, nondistended, BS + x 4. MS: no deformity or atrophy. Skin: warm and dry, no rash. Neuro:  Strength and sensation are intact. Psych: Normal affect.  Accessory Clinical Findings   Lab Results  Component Value Date   WBC 7.6 01/24/2016   HGB 16.5 (H) 01/24/2016   HCT 47.7 (H) 01/24/2016   PLT 279 01/24/2016   GLUCOSE 314 (H) 03/27/2016   CHOL 170 03/27/2016   TRIG 145 03/27/2016   HDL 48 (L) 03/27/2016   LDLCALC 93 03/27/2016   ALT 19 03/27/2016   AST 17 03/27/2016   NA 139 03/27/2016   K 4.2 03/27/2016   CL 102 03/27/2016   CREATININE 0.65 03/27/2016   BUN 11 03/27/2016   CO2 29 03/27/2016   TSH 2.613 09/20/2015   INR 1.7 01/24/2016   HGBA1C 9.0 (H) 12/19/2015      Echo 01/19/16: Study Conclusions  - Procedure narrative: Intravenous contrast (Definity) was   administered. - Left ventricle: LVEF is approximately 45% with akinesis of the   distal inferior/ inferoseptal wall and apex. The cavity size was   normal. Wall thickness was normal. Doppler parameters are   consistent with abnormal  left ventricular relaxation (grade 1   diastolic dysfunction).  Myoview 02/20/16: Study Highlights    The left ventricular ejection fraction is mildly decreased (45-54%).  Nuclear stress EF: 45%.  There are Q waves in the anterolateral leads.  There was no ST segment deviation noted during stress.  There is a large defect of severe severity present in the mid anterior, mid anteroseptal, mid inferior, apical anterior, apical septal, apical inferior, apical lateral and apex location. The defect is partially reversible.  This is a high risk study.    Assessment & Plan    1.  CAD:  S/p anterior MI 09/2015 with DES to the LAD and PTCA to the distal LAD.   Myoview showed a large area of infarct with peri-infarct ischemia.  EF 45%. She has known small vessel dzs involving the D1, OM1, OM3, and RPLB3.  She has not had  chest pain.  We will continue medical therapy at this time.  We had previously tried imdur but she did not tolerate this.  She remains on  blocker, acei, statin, and brilinta.  She does have prn nitrates.   2.  ICM/Chronic combined systolic and diastolic CHF:  EF 16% by echo in December.  BNP was very mildly elevated @ that time and lasix was added.  With this, she did note improvement in dyspnea.  Now Class 2-3. She is euvolemic on exam today.  She remains on  blocker and acei.  Will increase lisinopril to 5 mg daily. I think her current dyspnea on exertion is largely related to deconditioning and weight gain post MI. Strongly recommend increased aerobic activity and weight loss. Will refer to Cardiac Rehab.   3.  LV apical thrombus:  Resolved on echo in December.  Coumadin d/c'd @ that time.  4.  Insulin dependent DM:  Followed by PCP.  Cont acei/statin.  5.  HL:  Tolerating Crestor well. Will increase to 40 mg daily. LDL 93.   6.  Hypertensive Heart Disease- BP controlled.  7.  Dispo:  F/u lipids/cmet in 3 months and visit with Ignacia Bayley NP. Oked to have dental extraction  on DAPT.   Taariq Leitz Martinique, MD,FACC 05/13/2016, 1:58 PM

## 2016-05-13 ENCOUNTER — Ambulatory Visit (INDEPENDENT_AMBULATORY_CARE_PROVIDER_SITE_OTHER): Payer: Medicare Other | Admitting: Cardiology

## 2016-05-13 ENCOUNTER — Encounter: Payer: Self-pay | Admitting: Cardiology

## 2016-05-13 ENCOUNTER — Encounter: Payer: Self-pay | Admitting: *Deleted

## 2016-05-13 VITALS — BP 120/76 | HR 92 | Ht 64.0 in | Wt 203.0 lb

## 2016-05-13 DIAGNOSIS — I255 Ischemic cardiomyopathy: Secondary | ICD-10-CM | POA: Diagnosis not present

## 2016-05-13 DIAGNOSIS — I209 Angina pectoris, unspecified: Secondary | ICD-10-CM | POA: Diagnosis not present

## 2016-05-13 DIAGNOSIS — I10 Essential (primary) hypertension: Secondary | ICD-10-CM

## 2016-05-13 DIAGNOSIS — I25118 Atherosclerotic heart disease of native coronary artery with other forms of angina pectoris: Secondary | ICD-10-CM

## 2016-05-13 DIAGNOSIS — I5022 Chronic systolic (congestive) heart failure: Secondary | ICD-10-CM

## 2016-05-13 DIAGNOSIS — R0609 Other forms of dyspnea: Secondary | ICD-10-CM | POA: Diagnosis not present

## 2016-05-13 DIAGNOSIS — E782 Mixed hyperlipidemia: Secondary | ICD-10-CM | POA: Diagnosis not present

## 2016-05-13 MED ORDER — ROSUVASTATIN CALCIUM 40 MG PO TABS: 40.0000 mg | ORAL_TABLET | Freq: Every day | ORAL | 3 refills | Status: AC

## 2016-05-13 MED ORDER — LISINOPRIL 5 MG PO TABS: 5.0000 mg | ORAL_TABLET | Freq: Every day | ORAL | 3 refills | Status: DC

## 2016-05-13 NOTE — Addendum Note (Signed)
Addended by: Ricci Barker on: 05/13/2016 02:08 PM   Modules accepted: Orders

## 2016-05-13 NOTE — Patient Instructions (Signed)
Increase lisinopril to 5 mg daily  Increase Crestor to 40 mg daily  We will refer you to Cardiac Rehab  You need to get moving and lose weight  I will see you in 3 months.    We will repeat lab work in 3 months.

## 2016-06-12 ENCOUNTER — Other Ambulatory Visit: Payer: Self-pay | Admitting: Nurse Practitioner

## 2016-06-12 ENCOUNTER — Other Ambulatory Visit: Payer: Self-pay | Admitting: Cardiology

## 2016-06-12 DIAGNOSIS — R002 Palpitations: Secondary | ICD-10-CM

## 2016-06-12 NOTE — Telephone Encounter (Signed)
REFILL 

## 2016-06-25 ENCOUNTER — Inpatient Hospital Stay (HOSPITAL_COMMUNITY): Admission: RE | Admit: 2016-06-25 | Payer: Medicare Other | Source: Ambulatory Visit

## 2016-06-25 ENCOUNTER — Telehealth (HOSPITAL_COMMUNITY): Payer: Self-pay

## 2016-06-25 ENCOUNTER — Telehealth (HOSPITAL_COMMUNITY): Payer: Self-pay | Admitting: Cardiac Rehabilitation

## 2016-06-25 NOTE — Telephone Encounter (Signed)
Phone message received pt cancelled cardiac rehab orientation today due to nausea.  Pt will be contacted to reschedule.

## 2016-06-27 ENCOUNTER — Encounter (HOSPITAL_COMMUNITY): Payer: Self-pay

## 2016-06-27 ENCOUNTER — Encounter (HOSPITAL_COMMUNITY)
Admission: RE | Admit: 2016-06-27 | Discharge: 2016-06-27 | Disposition: A | Discharge status: Home / Self Care | Payer: Medicare Other | Source: Ambulatory Visit | Attending: Cardiology | Admitting: Cardiology

## 2016-06-27 VITALS — BP 118/82 | HR 82 | Ht 63.75 in | Wt 211.2 lb

## 2016-06-27 DIAGNOSIS — I5022 Chronic systolic (congestive) heart failure: Secondary | ICD-10-CM | POA: Diagnosis not present

## 2016-06-27 DIAGNOSIS — E782 Mixed hyperlipidemia: Secondary | ICD-10-CM | POA: Insufficient documentation

## 2016-06-27 DIAGNOSIS — I110 Hypertensive heart disease with heart failure: Secondary | ICD-10-CM | POA: Diagnosis not present

## 2016-06-27 DIAGNOSIS — I25118 Atherosclerotic heart disease of native coronary artery with other forms of angina pectoris: Secondary | ICD-10-CM | POA: Diagnosis not present

## 2016-06-27 DIAGNOSIS — I2102 ST elevation (STEMI) myocardial infarction involving left anterior descending coronary artery: Secondary | ICD-10-CM

## 2016-06-27 DIAGNOSIS — R0609 Other forms of dyspnea: Secondary | ICD-10-CM | POA: Insufficient documentation

## 2016-06-27 DIAGNOSIS — I255 Ischemic cardiomyopathy: Secondary | ICD-10-CM | POA: Diagnosis not present

## 2016-06-27 NOTE — Progress Notes (Signed)
Cardiac Individual Treatment Plan  Patient Details  Name: Shawna Sparks MRN: [id number] Date of Birth: [birth year] Referring Provider:     CARDIAC REHAB PHASE II ORIENTATION from 06/27/2016 in [place]  Referring Provider  Martinique, Peter MD      Initial Encounter Date:    CARDIAC REHAB PHASE II ORIENTATION from 06/27/2016 in [place]  Date  06/27/16  Referring Provider  Martinique, Peter MD      Visit Diagnosis: 09/20/15 ST elevation myocardial infarction involving left anterior descending (LAD) coronary artery ([place])  Patient's Home Medications on Admission:  Current Outpatient Prescriptions:  .  aspirin EC 81 MG tablet, Take 81 mg by mouth daily., Disp: , Rfl:  .  carvedilol (COREG) 3.125 MG tablet, TAKE 1 TABLET BY MOUTH IN THE MORNING AND 2 TABLETS IN THE EVENING, Disp: 270 tablet, Rfl: 1 .  citalopram (CELEXA) 40 MG tablet, Take 20 mg by mouth daily. , Disp: , Rfl: 3 .  fluconazole (DIFLUCAN) 150 MG tablet, 150 mg (1 tablet) daily as needed for yeast, Disp: , Rfl: 6 .  fluocinonide (LIDEX) 0.05 % external solution, Apply 1 application topically at bedtime as needed (for scaly itchy skiin). , Disp: , Rfl: 2 .  furosemide (LASIX) 20 MG tablet, TAKE 1 TABLET(20 MG) BY MOUTH DAILY, Disp: 30 tablet, Rfl: 4 .  insulin detemir (LEVEMIR) 100 UNIT/ML injection, Inject 30 Units into the skin at bedtime., Disp: , Rfl:  .  lisinopril (PRINIVIL,ZESTRIL) 5 MG tablet, Take 1 tablet (5 mg total) by mouth daily., Disp: 90 tablet, Rfl: 3 .  nitroGLYCERIN (NITROSTAT) 0.4 MG SL tablet, Place 1 tablet (0.4 mg total) under the tongue every 5 (five) minutes as needed for chest pain., Disp: 25 tablet, Rfl: 3 .  NOVOLOG 100 UNIT/ML injection, per sliding scale, Disp: , Rfl: 12 .  rosuvastatin (CRESTOR) 40 MG tablet, Take 1 tablet (40 mg total) by mouth daily., Disp: 90 tablet, Rfl: 3 .  ticagrelor (BRILINTA) 90 MG TABS tablet, Take 1 tablet  (90 mg total) by mouth 2 (two) times daily., Disp: 60 tablet, Rfl: 10 .  traZODone (DESYREL) 100 MG tablet, TAKE 1 TABLET BY MOUTH AT BEDTIME, Disp: 30 tablet, Rfl: 0 .  valACYclovir (VALTREX) 500 MG tablet, Take 1,000 mg by mouth 2 (two) times daily as needed (for outbreaks). , Disp: , Rfl: 3 .  vitamin B-12 (CYANOCOBALAMIN) 1000 MCG tablet, Take 1,000 mcg by mouth daily., Disp: , Rfl:  .  Vitamin D, Ergocalciferol, (DRISDOL) 50000 UNITS CAPS capsule, Take 1 capsule (50,000 units) every Friday, Disp: , Rfl: 3  Past Medical History: Past Medical History:  Diagnosis Date  . Adrenal adenoma right   accidental finding 6 yrs ago-   . Arthritis    knees  . Attention deficit disorder (ADD) t  . CAD (coronary artery disease)    a. 09/2015 Ant STEMI/PCI: LM nl, LAD 100p (2.5x38 Synergy DES - 3.0), 90d (PTCA), D1 90, lat branch 99, LCX nl, OM1 70, OM3 70, RCA 20p, RPLB3 100;  b. 02/2016 Lexi MV: Ef 45%, large mid ant/antsept/inf/apical ant/apical septa/apical inf/apical lateral/apical defect with partial reversibility.  . Chronic combined systolic and diastolic CHF (congestive heart failure) ([place])    a. 09/2015 Echo: EF 35-40%, Gr1 DD;  b. 01/19/2016 Echo: EF 45%, distal inf/infseptal, apical AK, Gr1 DD.  Marland Kitchen Depression   . Diabetes mellitus    a. on Insulin, A1c 11.7 in 09/2015  .  High cholesterol    a. myalgias w/ lipitor - tolerating crestor.  . Insomnia   . Ischemic cardiomyopathy    a. 09/2015 Echo: EF 35-40%, mid-apical anteroseptal, infsept, infap, antap, and apical AK, Gr1 DD, ? apical thrombus, triv MR, mild TR.  . LV (left ventricular) mural thrombus following MI ([place])    a. Dx 09/2015 following Ant STEMI-->coumadin;  b. 01/2016 Echo: Ef 45%, no LV thrombus.  . Ureteral stone left  . Urinary incontinence     Tobacco Use: History  Smoking Status  . Never Smoker  Smokeless Tobacco  . Never Used    Labs: Recent Review Flowsheet Data    Labs for ITP Cardiac and Pulmonary Rehab Latest  Ref Rng & Units 09/20/2015 09/21/2015 11/20/2015 12/19/2015 03/27/2016   Cholestrol <200 mg/dL 277(H) 235(H) 221(H) - 170   LDLCALC <100 mg/dL 187(H) 149(H) 122 - 93   HDL >50 mg/dL 51 42 52 - 48(L)   Trlycerides <150 mg/dL 195(H) 221(H) 235(H) - 145   Hemoglobin A1c <5.7 % 11.7(H) - - 9.0(H) -   TCO2 0 - 100 mmol/L 23 - - - -      Capillary Blood Glucose: Lab Results  Component Value Date   GLUCAP 217 (H) 09/24/2015   GLUCAP 241 (H) 09/23/2015   GLUCAP 212 (H) 09/23/2015   GLUCAP 231 (H) 09/23/2015   GLUCAP 271 (H) 09/23/2015     Exercise Target Goals: Date: 06/27/16  Exercise Program Goal: Individual exercise prescription set with THRR, safety & activity barriers. Participant demonstrates ability to understand and report RPE using BORG scale, to self-measure pulse accurately, and to acknowledge the importance of the exercise prescription.  Exercise Prescription Goal: Starting with aerobic activity 30 plus minutes a day, 3 days per week for initial exercise prescription. Provide home exercise prescription and guidelines that participant acknowledges understanding prior to discharge.  Activity Barriers & Risk Stratification:     Activity Barriers & Cardiac Risk Stratification - 06/27/16 1725      Activity Barriers & Cardiac Risk Stratification   Activity Barriers Arthritis   Cardiac Risk Stratification High      6 Minute Walk:     6 Minute Walk    Row Name 06/27/16 1416         6 Minute Walk   Phase Initial     Distance 1261 feet     Walk Time 6 minutes     # of Rest Breaks 0     MPH 2.4     METS 2.5     RPE 13     VO2 Peak 8.7     Symptoms No     Resting HR 82 bpm     Resting BP 118/82     Max Ex. HR 96 bpm     Max Ex. BP 124/60        Oxygen Initial Assessment:   Oxygen Re-Evaluation:   Oxygen Discharge (Final Oxygen Re-Evaluation):   Initial Exercise Prescription:     Initial Exercise Prescription - 06/27/16 1400      Date of Initial  Exercise RX and Referring Provider   Date 06/27/16   Referring Provider Martinique, Peter MD     Treadmill   MPH 2.2   Grade 0   Minutes 10   METs 2.68     Bike   Level 0.8   Minutes 10   METs 2.6     NuStep   Level 2   SPM 85   Minutes  10   METs 1.9     Prescription Details   Frequency (times per week) 3   Duration Progress to 45 minutes of aerobic exercise without signs/symptoms of physical distress     Intensity   THRR 40-80% of Max Heartrate 62-124   Ratings of Perceived Exertion 11-13   Perceived Dyspnea 0-4     Progression   Progression Continue to progress workloads to maintain intensity without signs/symptoms of physical distress.     Resistance Training   Training Prescription Yes   Weight 2   Reps 10-15      Perform Capillary Blood Glucose checks as needed.  Exercise Prescription Changes:   Exercise Comments:   Exercise Goals and Review:   Exercise Goals Re-Evaluation :    Discharge Exercise Prescription (Final Exercise Prescription Changes):   Nutrition:  Target Goals: Understanding of nutrition guidelines, daily intake of sodium 1500mg , cholesterol 200mg , calories 30% from fat and 7% or less from saturated fats, daily to have 5 or more servings of fruits and vegetables.  Biometrics:     Pre Biometrics - 06/27/16 1425      Pre Biometrics   Waist Circumference 47 inches   Hip Circumference 48 inches   Waist to Hip Ratio 0.98 %   Triceps Skinfold 31 mm   % Body Fat 48.4 %   Grip Strength 24 kg   Flexibility 8.5 in   Single Leg Stand 7.4 seconds       Nutrition Therapy Plan and Nutrition Goals:   Nutrition Discharge: Nutrition Scores:   Nutrition Goals Re-Evaluation:   Nutrition Goals Re-Evaluation:   Nutrition Goals Discharge (Final Nutrition Goals Re-Evaluation):   Psychosocial: Target Goals: Acknowledge presence or absence of significant depression and/or stress, maximize coping skills, provide positive support  system. Participant is able to verbalize types and ability to use techniques and skills needed for reducing stress and depression.  Initial Review & Psychosocial Screening:     Initial Psych Review & Screening - 06/27/16 1710      Initial Review   Current issues with Current Depression     Barriers   Psychosocial barriers to participate in program The patient should benefit from training in stress management and relaxation.  per Nursing assessment pt rates depression as Medium      Quality of Life Scores:     Quality of Life - 06/27/16 1712      Quality of Life Scores   Health/Function Pre 17.75 %   Socioeconomic Pre 25.42 %   Psych/Spiritual Pre 18.43 %   Family Pre 21 %   GLOBAL Pre 19.81 %      PHQ-9: Recent Review Flowsheet Data    There is no flowsheet data to display.     Interpretation of Total Score  Total Score Depression Severity:  1-4 = Minimal depression, 5-9 = Mild depression, 10-14 = Moderate depression, 15-19 = Moderately severe depression, 20-27 = Severe depression   Psychosocial Evaluation and Intervention:   Psychosocial Re-Evaluation:   Psychosocial Discharge (Final Psychosocial Re-Evaluation):   Vocational Rehabilitation: Provide vocational rehab assistance to qualifying candidates.   Vocational Rehab Evaluation & Intervention:     Vocational Rehab - 06/27/16 1715      Initial Vocational Rehab Evaluation & Intervention   Assessment shows need for Vocational Rehabilitation No      Education: Education Goals: Education classes will be provided on a weekly basis, covering required topics. Participant will state understanding/return demonstration of topics presented.  Learning Barriers/Preferences:  Learning Barriers/Preferences - 06/27/16 1713      Learning Barriers/Preferences   Learning Barriers Sight      Education Topics: Count Your Pulse:  -Group instruction provided by verbal instruction, demonstration, patient  participation and written materials to support subject.  Instructors address importance of being able to find your pulse and how to count your pulse when at home without a heart monitor.  Patients get hands on experience counting their pulse with staff help and individually.   Heart Attack, Angina, and Risk Factor Modification:  -Group instruction provided by verbal instruction, video, and written materials to support subject.  Instructors address signs and symptoms of angina and heart attacks.    Also discuss risk factors for heart disease and how to make changes to improve heart health risk factors.   Functional Fitness:  -Group instruction provided by verbal instruction, demonstration, patient participation, and written materials to support subject.  Instructors address safety measures for doing things around the house.  Discuss how to get up and down off the floor, how to pick things up properly, how to safely get out of a chair without assistance, and balance training.   Meditation and Mindfulness:  -Group instruction provided by verbal instruction, patient participation, and written materials to support subject.  Instructor addresses importance of mindfulness and meditation practice to help reduce stress and improve awareness.  Instructor also leads participants through a meditation exercise.    Stretching for Flexibility and Mobility:  -Group instruction provided by verbal instruction, patient participation, and written materials to support subject.  Instructors lead participants through series of stretches that are designed to increase flexibility thus improving mobility.  These stretches are additional exercise for major muscle groups that are typically performed during regular warm up and cool down.   Hands Only CPR:  -Group verbal, video, and participation provides a basic overview of AHA guidelines for community CPR. Role-play of emergencies allow participants the opportunity to  practice calling for help and chest compression technique with discussion of AED use.   Hypertension: -Group verbal and written instruction that provides a basic overview of hypertension including the most recent diagnostic guidelines, risk factor reduction with self-care instructions and medication management.    Nutrition I class: Heart Healthy Eating:  -Group instruction provided by PowerPoint slides, verbal discussion, and written materials to support subject matter. The instructor gives an explanation and review of the Therapeutic Lifestyle Changes diet recommendations, which includes a discussion on lipid goals, dietary fat, sodium, fiber, plant stanol/sterol esters, sugar, and the components of a well-balanced, healthy diet.   Nutrition II class: Lifestyle Skills:  -Group instruction provided by PowerPoint slides, verbal discussion, and written materials to support subject matter. The instructor gives an explanation and review of label reading, grocery shopping for heart health, heart healthy recipe modifications, and ways to make healthier choices when eating out.   Diabetes Question & Answer:  -Group instruction provided by PowerPoint slides, verbal discussion, and written materials to support subject matter. The instructor gives an explanation and review of diabetes co-morbidities, pre- and post-prandial blood glucose goals, pre-exercise blood glucose goals, signs, symptoms, and treatment of hypoglycemia and hyperglycemia, and foot care basics.   Diabetes Blitz:  -Group instruction provided by PowerPoint slides, verbal discussion, and written materials to support subject matter. The instructor gives an explanation and review of the physiology behind type 1 and type 2 diabetes, diabetes medications and rational behind using different medications, pre- and post-prandial blood glucose recommendations and Hemoglobin A1c goals, diabetes  diet, and exercise including blood glucose guidelines  for exercising safely.    Portion Distortion:  -Group instruction provided by PowerPoint slides, verbal discussion, written materials, and food models to support subject matter. The instructor gives an explanation of serving size versus portion size, changes in portions sizes over the last 20 years, and what consists of a serving from each food group.   Stress Management:  -Group instruction provided by verbal instruction, video, and written materials to support subject matter.  Instructors review role of stress in heart disease and how to cope with stress positively.     Exercising on Your Own:  -Group instruction provided by verbal instruction, power point, and written materials to support subject.  Instructors discuss benefits of exercise, components of exercise, frequency and intensity of exercise, and end points for exercise.  Also discuss use of nitroglycerin and activating EMS.  Review options of places to exercise outside of rehab.  Review guidelines for sex with heart disease.   Cardiac Drugs I:  -Group instruction provided by verbal instruction and written materials to support subject.  Instructor reviews cardiac drug classes: antiplatelets, anticoagulants, beta blockers, and statins.  Instructor discusses reasons, side effects, and lifestyle considerations for each drug class.   Cardiac Drugs II:  -Group instruction provided by verbal instruction and written materials to support subject.  Instructor reviews cardiac drug classes: angiotensin converting enzyme inhibitors (ACE-I), angiotensin II receptor blockers (ARBs), nitrates, and calcium channel blockers.  Instructor discusses reasons, side effects, and lifestyle considerations for each drug class.   Anatomy and Physiology of the Circulatory System:  Group verbal and written instruction and models provide basic cardiac anatomy and physiology, with the coronary electrical and arterial systems. Review of: AMI, Angina, Valve  disease, Heart Failure, Peripheral Artery Disease, Cardiac Arrhythmia, Pacemakers, and the ICD.   Other Education:  -Group or individual verbal, written, or video instructions that support the educational goals of the cardiac rehab program.   Knowledge Questionnaire Score:   Core Components/Risk Factors/Patient Goals at Admission:     Personal Goals and Risk Factors at Admission - 06/27/16 1705      Core Components/Risk Factors/Patient Goals on Admission    Weight Management Obesity   Admit Weight 211 lb 3.2 oz (95.8 kg)   Develop more efficient breathing techniques such as purse lipped breathing and diaphragmatic breathing; and practicing self-pacing with activity Yes   Diabetes Yes   Heart Failure Yes   Lipids Yes      Core Components/Risk Factors/Patient Goals Review:    Core Components/Risk Factors/Patient Goals at Discharge (Final Review):    ITP Comments:     ITP Comments    Row Name 06/27/16 1342           ITP Comments Medical Director, Dr. Fransico Him          Comments:  Patient attended Cardiac Rehab phase II orientation from 1330 to 1434 to review rules and guidelines for program. Completed 6 minute walk test, Intitial ITP, and exercise prescription.  VSS. Telemetry-SR.  Asymptomatic. Brief Psychosocial Assessment reveals no immediate barriers to participating in cardiac rehab. Pt has history of depression and plans to talk with her doctor- Dr. Sharlett Iles.  Pt is looking forward to participating in cardiac rehab so she can improve her diabetes management, improve shortness of breath and perform activities of daily living with ease. Cherre Huger, BSN Cardiac and Training and development officer

## 2016-07-01 ENCOUNTER — Encounter (HOSPITAL_COMMUNITY): Payer: Medicare Other

## 2016-07-01 ENCOUNTER — Encounter (HOSPITAL_COMMUNITY): Payer: Self-pay

## 2016-07-03 ENCOUNTER — Telehealth: Payer: Self-pay | Admitting: Cardiology

## 2016-07-03 ENCOUNTER — Encounter (HOSPITAL_COMMUNITY)
Admission: RE | Admit: 2016-07-03 | Discharge: 2016-07-03 | Disposition: A | Discharge status: Home / Self Care | Payer: Medicare Other | Source: Ambulatory Visit | Attending: Cardiology | Admitting: Cardiology

## 2016-07-03 NOTE — Telephone Encounter (Signed)
New message    Call after 430p    lisinopril (PRINIVIL,ZESTRIL) 5 MG tablet Take 1 tablet (5 mg total) by mouth daily.   Pt wants to speak to you about changing her medication

## 2016-07-03 NOTE — Telephone Encounter (Signed)
Returned call to patient.She stated since she increased Lisinopril to 5 mg daily she has developed a dry cough.PCP had advised to take Losartan instead,but she wanted Dr.Jordan's advice.Message sent to [place].

## 2016-07-03 NOTE — Progress Notes (Signed)
Incomplete Session Note  Patient Details  Name: Shawna Sparks MRN: [id number] Date of Birth: May 17, 1950 Referring Provider:     CARDIAC REHAB PHASE II ORIENTATION from 06/27/2016 in [place]  Referring Provider  Martinique, Peter MD      Shawna Sparks did not complete her rehab session.  Shawna Sparks came to cardiac rehab for her first day of exercise and decided that phase 2 cardiac rehab is not for her. Shawna Sparks said the group exercise setting is not going be a comfortable fit for her. Shawna Sparks says she would rather exercise on her own. Will discharge from cardiac rehab per patient Shawna Cater, RN,BSN 07/03/2016 2:58 PM

## 2016-07-04 MED ORDER — LOSARTAN POTASSIUM 25 MG PO TABS: 25.0000 mg | ORAL_TABLET | Freq: Every day | ORAL | 6 refills | Status: DC

## 2016-07-04 NOTE — Telephone Encounter (Signed)
Yes I would switch to losartan 25 mg daily  Peter Martinique MD, [facility]

## 2016-07-04 NOTE — Telephone Encounter (Signed)
Returned call to patient Dr.Jordan recommendations given.

## 2016-07-05 ENCOUNTER — Encounter (HOSPITAL_COMMUNITY): Payer: Medicare Other

## 2016-07-10 ENCOUNTER — Encounter (HOSPITAL_COMMUNITY): Payer: Medicare Other

## 2016-07-12 ENCOUNTER — Encounter (HOSPITAL_COMMUNITY): Payer: Medicare Other

## 2016-07-15 ENCOUNTER — Encounter (HOSPITAL_COMMUNITY): Payer: Medicare Other

## 2016-07-17 ENCOUNTER — Encounter (HOSPITAL_COMMUNITY): Payer: Medicare Other

## 2016-07-18 ENCOUNTER — Telehealth: Payer: Self-pay | Admitting: Cardiology

## 2016-07-18 DIAGNOSIS — F332 Major depressive disorder, recurrent severe without psychotic features: Secondary | ICD-10-CM | POA: Diagnosis not present

## 2016-07-18 NOTE — Telephone Encounter (Signed)
Drug-drug interaction profile for effexor is very similar to celexa.  Okay to take as prescribed.    Recommend to monitoring BP for few weeks and call use is trending upward. We may need to adjust losartan if BP increased.

## 2016-07-18 NOTE — Telephone Encounter (Signed)
New message   Pt is calling asking for a call back from RN. She wants to talk about medication changes that her PCP suggested.

## 2016-07-18 NOTE — Telephone Encounter (Signed)
Returned call to patient. Patient reports her PCP would like to stop her Celexa and start her on Effexor150mg  daily.  Would like to make sure Dr. Martinique is okay with this change and verify there are no possible interactions with cardiac medications.    Routed to Dr. Martinique and pharmacy.  Patient aware.

## 2016-07-19 ENCOUNTER — Encounter (HOSPITAL_COMMUNITY): Payer: Medicare Other

## 2016-07-22 ENCOUNTER — Encounter (HOSPITAL_COMMUNITY): Payer: Medicare Other

## 2016-07-22 NOTE — Telephone Encounter (Signed)
Returned call to patient Shawna Sparks's recommendations given.Stated she already received phone call.

## 2016-07-24 ENCOUNTER — Encounter (HOSPITAL_COMMUNITY): Payer: Medicare Other

## 2016-07-26 ENCOUNTER — Encounter (HOSPITAL_COMMUNITY): Payer: Medicare Other

## 2016-07-29 ENCOUNTER — Encounter (HOSPITAL_COMMUNITY): Payer: Medicare Other

## 2016-07-31 ENCOUNTER — Encounter (HOSPITAL_COMMUNITY): Payer: Medicare Other

## 2016-08-02 ENCOUNTER — Encounter (HOSPITAL_COMMUNITY): Payer: Medicare Other

## 2016-08-05 ENCOUNTER — Encounter (HOSPITAL_COMMUNITY): Payer: Medicare Other

## 2016-08-07 ENCOUNTER — Encounter (HOSPITAL_COMMUNITY): Payer: Medicare Other

## 2016-08-07 DIAGNOSIS — F332 Major depressive disorder, recurrent severe without psychotic features: Secondary | ICD-10-CM | POA: Diagnosis not present

## 2016-08-09 ENCOUNTER — Encounter (HOSPITAL_COMMUNITY): Payer: Medicare Other

## 2016-08-12 ENCOUNTER — Encounter (HOSPITAL_COMMUNITY): Payer: Medicare Other

## 2016-08-14 NOTE — Progress Notes (Deleted)
Office Visit    Patient Name: Shawna Sparks Date of Encounter: 08/14/2016  Primary Care Provider:  Leanna Battles, MD Primary Cardiologist:  P. Martinique, MD   Chief Complaint    66 year old female status post anterior STEMI in August 2017, with history of diabetes, hyperlipidemia, ischemic cardiomyopathy, chronic systolic CHF, and LV apical thrombus who presents for follow-up.  Past Medical History    Past Medical History:  Diagnosis Date  . Adrenal adenoma right   accidental finding 6 yrs ago-   . Arthritis    knees  . Attention deficit disorder (ADD) t  . CAD (coronary artery disease)    a. 09/2015 Ant STEMI/PCI: LM nl, LAD 100p (2.5x38 Synergy DES - 3.0), 90d (PTCA), D1 90, lat branch 99, LCX nl, OM1 70, OM3 70, RCA 20p, RPLB3 100;  b. 02/2016 Lexi MV: Ef 45%, large mid ant/antsept/inf/apical ant/apical septa/apical inf/apical lateral/apical defect with partial reversibility.  . Chronic combined systolic and diastolic CHF (congestive heart failure) ([place])    a. 09/2015 Echo: EF 35-40%, Gr1 DD;  b. 01/19/2016 Echo: EF 45%, distal inf/infseptal, apical AK, Gr1 DD.  Marland Kitchen Depression   . Diabetes mellitus    a. on Insulin, A1c 11.7 in 09/2015  . High cholesterol    a. myalgias w/ lipitor - tolerating crestor.  . Insomnia   . Ischemic cardiomyopathy    a. 09/2015 Echo: EF 35-40%, mid-apical anteroseptal, infsept, infap, antap, and apical AK, Gr1 DD, ? apical thrombus, triv MR, mild TR.  . LV (left ventricular) mural thrombus following MI ([place])    a. Dx 09/2015 following Ant STEMI-->coumadin;  b. 01/2016 Echo: Ef 45%, no LV thrombus.  . Ureteral stone left  . Urinary incontinence    Past Surgical History:  Procedure Laterality Date  . CARDIAC CATHETERIZATION  2000   wnl  . CARDIAC CATHETERIZATION N/A 09/20/2015   Procedure: Left Heart Cath and Coronary Angiography;  Surgeon: Avian Greenawalt M Martinique, MD;  Location: [facility];  Service: Cardiovascular;  Laterality: N/A;  . CARDIAC  CATHETERIZATION N/A 09/20/2015   Procedure: Coronary Stent Intervention;  Surgeon: Inetha Maret M Martinique, MD;  Location: [facility];  Service: Cardiovascular;  Laterality: N/A;  . CHOLECYSTECTOMY  1973  . CYSTOSCOPY/RETROGRADE/URETEROSCOPY  12/27/2010   Procedure: CYSTOSCOPY/RETROGRADE/URETEROSCOPY;  Surgeon: Malka So;  Location: [facility];  Service: Urology;  Laterality: N/A;  cystoscopy left retrograde ureterocopy stone exstraction poss holmium laser  . GASTRIC BYPASS  2004  . LUMBAR DISC SURGERY    . LUMBAR FUSION  1997   L 4 - 5  . LUMBAR MICRODISCECTOMY  1996   L4 - 5  . VAGINAL HYSTERECTOMY  1996    Allergies  Allergies  Allergen Reactions  . Codeine Shortness Of Breath  . Benadryl [Diphenhydramine Hcl (Sleep)] Itching  . Dilaudid [Hydromorphone Hcl] Rash  . Flexeril [Cyclobenzaprine] Hives, Itching and Rash    History of Present Illness    66 year old female with the above, complex past medical history including coronary artery disease status post anterior MI in August 2017 requiring stenting of the proximal LAD and PTCA of the distal LAD. She also had small vessel disease including the diagonal branch, OM1, OM 3, and RPLB3. EF was 35-40%.   She had DOE in the setting of 30 lbs wt gain and deconditioning.   Echo on 12/8 showed stable LV dysfxn with an EF of 45%.  LV thrombus had resolved.  When seen 12/13  she continued  to have DOE.  She also reported some atypical/fleeting c/p.  BNP was only mildly elevated and added lasix 20 mg daily with improved exercise tolerance.     A Myoview study  was performed and was abnl with a large area of infarct and mild peri-infarct ischemia.  EF45%. She has known small vessel dzs.    On her last visit she was started on lisinopril 5 mg daily. Later developed a dry cough and was switched to losartan 25 mg daily.   On follow up today she still has some dyspnea on exertion. She states this is better on lasix but not resolved.  No edema.  She denies palpitations, pnd, orthopnea, n, v, dizziness, syncope, edema, weight gain, or early satiety. She is very sedentary. States she is depressed so doesn't get out much. Never did Cardiac Rehab. BS control is poor. Reports she had overnight oximetry which was OK.   Home Medications    Prior to Admission medications   Medication Sig Start Date End Date Taking? Authorizing Provider  aspirin EC 81 MG tablet Take 81 mg by mouth daily.   Yes Historical Provider, MD  carvedilol (COREG) 3.125 MG tablet Take one tablet in the AM and take two tablets in the PM 12/06/15  Yes Jettie Booze, MD  citalopram (CELEXA) 40 MG tablet Take 20 mg by mouth daily.  08/14/15  Yes Historical Provider, MD  fluconazole (DIFLUCAN) 150 MG tablet 150 mg (1 tablet) daily as needed for yeast 11/11/14  Yes Historical Provider, MD  fluocinonide (LIDEX) 0.05 % external solution Apply 1 application topically at bedtime as needed (for scaly itchy skiin).  08/14/15  Yes Historical Provider, MD  furosemide (LASIX) 20 MG tablet Take 1 tablet (20 mg total) by mouth daily. 03/06/16 06/04/16 Yes Rogelia Mire, NP  NOVOLOG 100 UNIT/ML injection per sliding scale 11/11/14  Yes Historical Provider, MD  ticagrelor (BRILINTA) 90 MG TABS tablet Take 1 tablet (90 mg total) by mouth 2 (two) times daily. 12/06/15  Yes Jettie Booze, MD  traZODone (DESYREL) 100 MG tablet TAKE 1 TABLET BY MOUTH AT BEDTIME 01/15/16  Yes Larey Seat, MD  valACYclovir (VALTREX) 500 MG tablet Take 1,000 mg by mouth 2 (two) times daily as needed (for outbreaks).  08/14/15  Yes Historical Provider, MD  vitamin B-12 (CYANOCOBALAMIN) 1000 MCG tablet Take 1,000 mcg by mouth daily.   Yes Historical Provider, MD  Vitamin D, Ergocalciferol, (DRISDOL) 50000 UNITS CAPS capsule Take 1 capsule (50,000 units) every Friday 09/07/14  Yes Historical Provider, MD  lisinopril (PRINIVIL,ZESTRIL) 2.5 MG tablet Take 1 tablet (2.5 mg total) by mouth daily. 12/06/15  03/05/16  Jettie Booze, MD  nitroGLYCERIN (NITROSTAT) 0.4 MG SL tablet Place 1 tablet (0.4 mg total) under the tongue every 5 (five) minutes as needed for chest pain. 12/06/15 03/05/16  Brittainy M Simmons, PA-C  rosuvastatin (CRESTOR) 20 MG tablet Take 1 tablet (20 mg total) by mouth daily. 12/06/15 03/05/16  Jettie Booze, MD    Review of Systems    As note in HPI.   All other systems reviewed and are otherwise negative except as noted above.  Physical Exam    VS:  There were no vitals taken for this visit. , BMI There is no height or weight on file to calculate BMI. Repeat bp 124/80. GEN: Well nourished, obese, in no acute distress.  HEENT: normal.  Neck: Supple, JVP difficult to assess 2/2 body habitus but no obvious JVD, carotid bruits, or  masses. Cardiac: RRR, no murmurs, rubs, or gallops. No clubbing, cyanosis, edema.  Radials/DP/PT 2+ and equal bilaterally.  Respiratory:  Respirations regular and unlabored, clear to auscultation bilaterally. GI: Soft, nontender, nondistended, BS + x 4. MS: no deformity or atrophy. Skin: warm and dry, no rash. Neuro:  Strength and sensation are intact. Psych: Normal affect.  Accessory Clinical Findings   Lab Results  Component Value Date   WBC 7.6 01/24/2016   HGB 16.5 (H) 01/24/2016   HCT 47.7 (H) 01/24/2016   PLT 279 01/24/2016   GLUCOSE 314 (H) 03/27/2016   CHOL 170 03/27/2016   TRIG 145 03/27/2016   HDL 48 (L) 03/27/2016   LDLCALC 93 03/27/2016   ALT 19 03/27/2016   AST 17 03/27/2016   NA 139 03/27/2016   K 4.2 03/27/2016   CL 102 03/27/2016   CREATININE 0.65 03/27/2016   BUN 11 03/27/2016   CO2 29 03/27/2016   TSH 2.613 09/20/2015   INR 1.7 01/24/2016   HGBA1C 9.0 (H) 12/19/2015   07/02/16: A1c 10.5%   Echo 01/19/16: Study Conclusions  - Procedure narrative: Intravenous contrast (Definity) was   administered. - Left ventricle: LVEF is approximately 45% with akinesis of the   distal inferior/ inferoseptal  wall and apex. The cavity size was   normal. Wall thickness was normal. Doppler parameters are   consistent with abnormal left ventricular relaxation (grade 1   diastolic dysfunction).  Myoview 02/20/16: Study Highlights    The left ventricular ejection fraction is mildly decreased (45-54%).  Nuclear stress EF: 45%.  There are Q waves in the anterolateral leads.  There was no ST segment deviation noted during stress.  There is a large defect of severe severity present in the mid anterior, mid anteroseptal, mid inferior, apical anterior, apical septal, apical inferior, apical lateral and apex location. The defect is partially reversible.  This is a high risk study.    Assessment & Plan    1.  CAD:  S/p anterior MI 09/2015 with DES to the LAD and PTCA to the distal LAD.   Myoview showed a large area of infarct with peri-infarct ischemia.  EF 45%. She has known small vessel dzs involving the D1, OM1, OM3, and RPLB3.  She has not had  chest pain.  We will continue medical therapy at this time.  We had previously tried imdur but she did not tolerate this.  She remains on  blocker, ARB, statin, and brilinta.  She does have prn nitrates.   2.  ICM/Chronic combined systolic and diastolic CHF:  EF 23% by echo in December.  BNP was very mildly elevated @ that time and lasix was added.  With this, she did note improvement in dyspnea.  Now Class 2-3. She is euvolemic on exam today.  She remains on  blocker and ARB.   I think her current dyspnea on exertion is largely related to deconditioning and weight gain post MI. Strongly recommend increased aerobic activity and weight loss. Will refer to Cardiac Rehab.   3.  LV apical thrombus:  Resolved on echo in December.  Coumadin d/c'd @ that time.  4.  Insulin dependent DM:  Followed by PCP.  Cont ARB/statin.  5.  HL:  Tolerating Crestor well. Will increase to 40 mg daily. LDL 93.   6.  Hypertensive Heart Disease- BP controlled.   Jamieson Hetland Martinique,  MD,FACC 08/14/2016, 4:06 PM

## 2016-08-15 ENCOUNTER — Ambulatory Visit: Payer: Medicare Other | Admitting: Cardiology

## 2016-08-16 ENCOUNTER — Encounter (HOSPITAL_COMMUNITY): Payer: Medicare Other

## 2016-08-19 ENCOUNTER — Encounter (HOSPITAL_COMMUNITY): Payer: Medicare Other

## 2016-08-19 DIAGNOSIS — F332 Major depressive disorder, recurrent severe without psychotic features: Secondary | ICD-10-CM | POA: Diagnosis not present

## 2016-08-21 ENCOUNTER — Encounter (HOSPITAL_COMMUNITY): Payer: Medicare Other

## 2016-08-23 ENCOUNTER — Encounter (HOSPITAL_COMMUNITY): Payer: Medicare Other

## 2016-08-26 ENCOUNTER — Encounter (HOSPITAL_COMMUNITY): Payer: Medicare Other

## 2016-08-28 ENCOUNTER — Encounter (HOSPITAL_COMMUNITY): Payer: Medicare Other

## 2016-08-30 ENCOUNTER — Encounter (HOSPITAL_COMMUNITY): Payer: Medicare Other

## 2016-08-30 DIAGNOSIS — F332 Major depressive disorder, recurrent severe without psychotic features: Secondary | ICD-10-CM | POA: Diagnosis not present

## 2016-09-01 ENCOUNTER — Other Ambulatory Visit: Payer: Self-pay | Admitting: Student

## 2016-09-02 ENCOUNTER — Encounter (HOSPITAL_COMMUNITY): Payer: Medicare Other

## 2016-09-02 NOTE — Telephone Encounter (Signed)
This is Dr. Jordan's pt. °

## 2016-09-02 NOTE — Telephone Encounter (Signed)
REFILL 

## 2016-09-04 ENCOUNTER — Encounter (HOSPITAL_COMMUNITY): Payer: Medicare Other

## 2016-09-05 ENCOUNTER — Ambulatory Visit: Payer: Medicare Other | Admitting: Cardiology

## 2016-09-06 ENCOUNTER — Encounter (HOSPITAL_COMMUNITY): Payer: Medicare Other

## 2016-09-09 ENCOUNTER — Encounter (HOSPITAL_COMMUNITY): Payer: Medicare Other

## 2016-09-11 ENCOUNTER — Encounter (HOSPITAL_COMMUNITY): Payer: Medicare Other

## 2016-09-13 ENCOUNTER — Encounter (HOSPITAL_COMMUNITY): Payer: Medicare Other

## 2016-09-16 ENCOUNTER — Encounter (HOSPITAL_COMMUNITY): Payer: Medicare Other

## 2016-09-18 ENCOUNTER — Encounter (HOSPITAL_COMMUNITY): Payer: Medicare Other

## 2016-09-19 DIAGNOSIS — F332 Major depressive disorder, recurrent severe without psychotic features: Secondary | ICD-10-CM | POA: Diagnosis not present

## 2016-09-20 ENCOUNTER — Encounter (HOSPITAL_COMMUNITY): Payer: Medicare Other

## 2016-09-22 NOTE — Progress Notes (Signed)
Office Visit    Patient Name: Shawna Sparks Date of Encounter: 09/23/2016  Primary Care Provider:  Leanna Battles, MD Primary Cardiologist:  P. Martinique, MD   Chief Complaint    66 year old female status post anterior STEMI in August 2017, with history of diabetes, hyperlipidemia, ischemic cardiomyopathy, chronic systolic CHF, and LV apical thrombus who presents for follow-up.  Past Medical History    Past Medical History:  Diagnosis Date  . Adrenal adenoma right   accidental finding 6 yrs ago-   . Arthritis    knees  . Attention deficit disorder (ADD) t  . CAD (coronary artery disease)    a. 09/2015 Ant STEMI/PCI: LM nl, LAD 100p (2.5x38 Synergy DES - 3.0), 90d (PTCA), D1 90, lat branch 99, LCX nl, OM1 70, OM3 70, RCA 20p, RPLB3 100;  b. 02/2016 Lexi MV: Ef 45%, large mid ant/antsept/inf/apical ant/apical septa/apical inf/apical lateral/apical defect with partial reversibility.  . Chronic combined systolic and diastolic CHF (congestive heart failure) ([place])    a. 09/2015 Echo: EF 35-40%, Gr1 DD;  b. 01/19/2016 Echo: EF 45%, distal inf/infseptal, apical AK, Gr1 DD.  Marland Kitchen Depression   . Diabetes mellitus    a. on Insulin, A1c 11.7 in 09/2015  . High cholesterol    a. myalgias w/ lipitor - tolerating crestor.  . Insomnia   . Ischemic cardiomyopathy    a. 09/2015 Echo: EF 35-40%, mid-apical anteroseptal, infsept, infap, antap, and apical AK, Gr1 DD, ? apical thrombus, triv MR, mild TR.  . LV (left ventricular) mural thrombus following MI ([place])    a. Dx 09/2015 following Ant STEMI-->coumadin;  b. 01/2016 Echo: Ef 45%, no LV thrombus.  . Ureteral stone left  . Urinary incontinence    Past Surgical History:  Procedure Laterality Date  . CARDIAC CATHETERIZATION  2000   wnl  . CARDIAC CATHETERIZATION N/A 09/20/2015   Procedure: Left Heart Cath and Coronary Angiography;  Surgeon: Ausha Sieh M Martinique, MD;  Location: [facility];  Service: Cardiovascular;  Laterality: N/A;  . CARDIAC  CATHETERIZATION N/A 09/20/2015   Procedure: Coronary Stent Intervention;  Surgeon: Leul Narramore M Martinique, MD;  Location: [facility];  Service: Cardiovascular;  Laterality: N/A;  . CHOLECYSTECTOMY  1973  . CYSTOSCOPY/RETROGRADE/URETEROSCOPY  12/27/2010   Procedure: CYSTOSCOPY/RETROGRADE/URETEROSCOPY;  Surgeon: Malka So;  Location: [facility];  Service: Urology;  Laterality: N/A;  cystoscopy left retrograde ureterocopy stone exstraction poss holmium laser  . GASTRIC BYPASS  2004  . LUMBAR DISC SURGERY    . LUMBAR FUSION  1997   L 4 - 5  . LUMBAR MICRODISCECTOMY  1996   L4 - 5  . VAGINAL HYSTERECTOMY  1996    Allergies  Allergies  Allergen Reactions  . Codeine Shortness Of Breath  . Benadryl [Diphenhydramine Hcl (Sleep)] Itching  . Dilaudid [Hydromorphone Hcl] Rash  . Flexeril [Cyclobenzaprine] Hives, Itching and Rash    History of Present Illness    66 year old female with the above, complex past medical history including coronary artery disease status post anterior MI in August 2017 requiring stenting of the proximal LAD and PTCA of the distal LAD. She also had small vessel disease including the diagonal branch, OM1, OM 3, and RPLB3. EF was 35-40%.   She had DOE in the setting of 30 lbs wt gain and deconditioning.   Echo on 12/8 showed stable LV dysfxn with an EF of 45%.  LV thrombus had resolved.  When seen 12/13  she continued  to have DOE.  She also reported some atypical/fleeting c/p.  BNP was only mildly elevated and added lasix 20 mg daily with improved exercise tolerance.     A Myoview study  was performed and was abnl with a large area of infarct and mild peri-infarct ischemia.  EF45%. She has known small vessel dzs.    On follow up today she states her dyspnea on exertion has improved.  No edema.  She denies palpitations, pnd, orthopnea, n, v, dizziness, syncope, edema, weight gain, or early satiety. No chest pain.  She is starting to exercise more. Did Cardiac  Rehab [facility]  Reports major depression and anxiety issues. Improved on Effexor. BS control is still poor with BS over 300. Needs 2 teeth extracted.  Home Medications    Prior to Admission medications   Medication Sig Start Date End Date Taking? Authorizing Provider  aspirin EC 81 MG tablet Take 81 mg by mouth daily.   Yes Historical Provider, MD  carvedilol (COREG) 3.125 MG tablet Take one tablet in the AM and take two tablets in the PM 12/06/15  Yes Jettie Booze, MD  citalopram (CELEXA) 40 MG tablet Take 20 mg by mouth daily.  08/14/15  Yes Historical Provider, MD  fluconazole (DIFLUCAN) 150 MG tablet 150 mg (1 tablet) daily as needed for yeast 11/11/14  Yes Historical Provider, MD  fluocinonide (LIDEX) 0.05 % external solution Apply 1 application topically at bedtime as needed (for scaly itchy skiin).  08/14/15  Yes Historical Provider, MD  furosemide (LASIX) 20 MG tablet Take 1 tablet (20 mg total) by mouth daily. 03/06/16 06/04/16 Yes Rogelia Mire, NP  NOVOLOG 100 UNIT/ML injection per sliding scale 11/11/14  Yes Historical Provider, MD  ticagrelor (BRILINTA) 90 MG TABS tablet Take 1 tablet (90 mg total) by mouth 2 (two) times daily. 12/06/15  Yes Jettie Booze, MD  traZODone (DESYREL) 100 MG tablet TAKE 1 TABLET BY MOUTH AT BEDTIME 01/15/16  Yes Larey Seat, MD  valACYclovir (VALTREX) 500 MG tablet Take 1,000 mg by mouth 2 (two) times daily as needed (for outbreaks).  08/14/15  Yes Historical Provider, MD  vitamin B-12 (CYANOCOBALAMIN) 1000 MCG tablet Take 1,000 mcg by mouth daily.   Yes Historical Provider, MD  Vitamin D, Ergocalciferol, (DRISDOL) 50000 UNITS CAPS capsule Take 1 capsule (50,000 units) every Friday 09/07/14  Yes Historical Provider, MD  lisinopril (PRINIVIL,ZESTRIL) 2.5 MG tablet Take 1 tablet (2.5 mg total) by mouth daily. 12/06/15 03/05/16  Jettie Booze, MD  nitroGLYCERIN (NITROSTAT) 0.4 MG SL tablet Place 1 tablet (0.4 mg  total) under the tongue every 5 (five) minutes as needed for chest pain. 12/06/15 03/05/16  Brittainy M Simmons, PA-C  rosuvastatin (CRESTOR) 20 MG tablet Take 1 tablet (20 mg total) by mouth daily. 12/06/15 03/05/16  Jettie Booze, MD    Review of Systems    As note in HPI.   All other systems reviewed and are otherwise negative except as noted above.  Physical Exam    VS:  BP 110/68   Pulse 84   Ht 5' 3.75" (1.619 m)   Wt 199 lb 9.6 oz (90.5 kg)   BMI 34.53 kg/m  , BMI Body mass index is 34.53 kg/m.  GENERAL:  Well appearing, obese WF in NAD. HEENT:  PERRL, EOMI, sclera are clear. Oropharynx is clear. NECK:  No jugular venous distention, carotid upstroke brisk and symmetric, no bruits, no thyromegaly or adenopathy LUNGS:  Clear to auscultation bilaterally CHEST:  Unremarkable HEART:  RRR,  PMI not displaced or sustained,S1 and S2 within normal limits, no S3, no S4: no clicks, no rubs, no murmurs ABD:  Soft, nontender. BS +, no masses or bruits. No hepatomegaly, no splenomegaly EXT:  2 + pulses throughout, no edema, no cyanosis no clubbing SKIN:  Warm and dry.  No rashes NEURO:  Alert and oriented x 3. Cranial nerves II through XII intact. PSYCH:  Cognitively intact    Accessory Clinical Findings   Lab Results  Component Value Date   WBC 7.6 01/24/2016   HGB 16.5 (H) 01/24/2016   HCT 47.7 (H) 01/24/2016   PLT 279 01/24/2016   GLUCOSE 314 (H) 03/27/2016   CHOL 170 03/27/2016   TRIG 145 03/27/2016   HDL 48 (L) 03/27/2016   LDLCALC 93 03/27/2016   ALT 19 03/27/2016   AST 17 03/27/2016   NA 139 03/27/2016   K 4.2 03/27/2016   CL 102 03/27/2016   CREATININE 0.65 03/27/2016   BUN 11 03/27/2016   CO2 29 03/27/2016   TSH 2.613 09/20/2015   INR 1.7 01/24/2016   HGBA1C 9.0 (H) 12/19/2015      Echo 01/19/16: Study Conclusions  - Procedure narrative: Intravenous contrast (Definity) was   administered. - Left ventricle: LVEF is approximately 45% with akinesis of  the   distal inferior/ inferoseptal wall and apex. The cavity size was   normal. Wall thickness was normal. Doppler parameters are   consistent with abnormal left ventricular relaxation (grade 1   diastolic dysfunction).  Myoview 02/20/16: Study Highlights    The left ventricular ejection fraction is mildly decreased (45-54%).  Nuclear stress EF: 45%.  There are Q waves in the anterolateral leads.  There was no ST segment deviation noted during stress.  There is a large defect of severe severity present in the mid anterior, mid anteroseptal, mid inferior, apical anterior, apical septal, apical inferior, apical lateral and apex location. The defect is partially reversible.  This is a high risk study.    Assessment & Plan    1.  CAD:  S/p anterior MI 09/2015 with DES to the LAD and PTCA to the distal LAD.   Myoview showed a large area of infarct with peri-infarct ischemia.  EF 45%. She has known small vessel dzs involving the D1, OM1, OM3, and RPLB3.  She has not had  chest pain.  We will continue medical therapy at this time.   She remains on  blocker, acei, and statin. She may stop taking Brilinta at this time.   2.  ICM/Chronic combined systolic and diastolic CHF:  EF 40% by echo in December. Now Class 2. She is euvolemic on exam today.  She remains on  blocker and acei.   Strongly recommend increased aerobic activity and weight loss.   3.  LV apical thrombus:  Resolved on echo in December.  Coumadin d/c'd @ that time.  4.  Insulin dependent DM:  Poorly controlled. Followed by PCP.  Cont acei/statin.  5.  HL:  Tolerating Crestor well. Will increase to 40 mg daily. She is on maximum dose of Crestor. Needs to focus on weight loss and diabetic control. Could consider adding Zetia but cost is a consideration  6.  Hypertensive Heart Disease- BP controlled.  Follow up in 6 months.  Samauri Kellenberger Martinique, MD,FACC 09/23/2016, 9:38 AM

## 2016-09-23 ENCOUNTER — Encounter (HOSPITAL_COMMUNITY): Payer: Medicare Other

## 2016-09-23 ENCOUNTER — Encounter: Payer: Self-pay | Admitting: Cardiology

## 2016-09-23 ENCOUNTER — Ambulatory Visit (INDEPENDENT_AMBULATORY_CARE_PROVIDER_SITE_OTHER): Payer: Medicare Other | Admitting: Cardiology

## 2016-09-23 VITALS — BP 110/68 | HR 84 | Ht 63.75 in | Wt 199.6 lb

## 2016-09-23 DIAGNOSIS — E782 Mixed hyperlipidemia: Secondary | ICD-10-CM

## 2016-09-23 DIAGNOSIS — I255 Ischemic cardiomyopathy: Secondary | ICD-10-CM | POA: Diagnosis not present

## 2016-09-23 DIAGNOSIS — I10 Essential (primary) hypertension: Secondary | ICD-10-CM | POA: Diagnosis not present

## 2016-09-23 DIAGNOSIS — I5022 Chronic systolic (congestive) heart failure: Secondary | ICD-10-CM | POA: Diagnosis not present

## 2016-09-23 DIAGNOSIS — I2109 ST elevation (STEMI) myocardial infarction involving other coronary artery of anterior wall: Secondary | ICD-10-CM | POA: Diagnosis not present

## 2016-09-23 DIAGNOSIS — I25118 Atherosclerotic heart disease of native coronary artery with other forms of angina pectoris: Secondary | ICD-10-CM

## 2016-09-23 DIAGNOSIS — I513 Intracardiac thrombosis, not elsewhere classified: Secondary | ICD-10-CM | POA: Diagnosis not present

## 2016-09-23 NOTE — Patient Instructions (Signed)
Stop taking Brilinta  Continue your other therapy  Focus on improved diabetic control and regular aerobic exercise  I will see you in 6 months.

## 2016-09-25 ENCOUNTER — Encounter (HOSPITAL_COMMUNITY): Payer: Medicare Other

## 2016-09-27 ENCOUNTER — Encounter (HOSPITAL_COMMUNITY): Payer: Medicare Other

## 2016-09-30 ENCOUNTER — Encounter (HOSPITAL_COMMUNITY): Payer: Medicare Other

## 2016-10-02 ENCOUNTER — Encounter (HOSPITAL_COMMUNITY): Payer: Medicare Other

## 2016-10-04 ENCOUNTER — Encounter (HOSPITAL_COMMUNITY): Payer: Medicare Other

## 2016-10-07 ENCOUNTER — Encounter (HOSPITAL_COMMUNITY): Payer: Medicare Other

## 2016-10-07 DIAGNOSIS — F332 Major depressive disorder, recurrent severe without psychotic features: Secondary | ICD-10-CM | POA: Diagnosis not present

## 2016-10-09 ENCOUNTER — Encounter (HOSPITAL_COMMUNITY): Payer: Medicare Other

## 2016-10-11 ENCOUNTER — Encounter (HOSPITAL_COMMUNITY): Payer: Medicare Other

## 2016-10-13 ENCOUNTER — Other Ambulatory Visit: Payer: Self-pay | Admitting: Student

## 2016-10-14 ENCOUNTER — Encounter (HOSPITAL_COMMUNITY): Payer: Medicare Other

## 2016-10-16 ENCOUNTER — Encounter (HOSPITAL_COMMUNITY): Payer: Medicare Other

## 2016-10-18 ENCOUNTER — Encounter (HOSPITAL_COMMUNITY): Payer: Medicare Other

## 2016-10-21 ENCOUNTER — Encounter (HOSPITAL_COMMUNITY): Payer: Medicare Other

## 2016-10-22 ENCOUNTER — Other Ambulatory Visit: Payer: Self-pay | Admitting: Nurse Practitioner

## 2016-10-23 ENCOUNTER — Encounter (HOSPITAL_COMMUNITY): Payer: Medicare Other

## 2016-10-25 ENCOUNTER — Encounter (HOSPITAL_COMMUNITY): Payer: Medicare Other

## 2016-10-28 DIAGNOSIS — F332 Major depressive disorder, recurrent severe without psychotic features: Secondary | ICD-10-CM | POA: Diagnosis not present

## 2016-11-11 DIAGNOSIS — F332 Major depressive disorder, recurrent severe without psychotic features: Secondary | ICD-10-CM | POA: Diagnosis not present

## 2016-11-22 DIAGNOSIS — F332 Major depressive disorder, recurrent severe without psychotic features: Secondary | ICD-10-CM | POA: Diagnosis not present

## 2016-12-11 DIAGNOSIS — F332 Major depressive disorder, recurrent severe without psychotic features: Secondary | ICD-10-CM | POA: Diagnosis not present

## 2016-12-30 DIAGNOSIS — F332 Major depressive disorder, recurrent severe without psychotic features: Secondary | ICD-10-CM | POA: Diagnosis not present

## 2017-01-01 ENCOUNTER — Other Ambulatory Visit: Payer: Self-pay | Admitting: *Deleted

## 2017-01-01 DIAGNOSIS — R002 Palpitations: Secondary | ICD-10-CM

## 2017-01-01 MED ORDER — CARVEDILOL 3.125 MG PO TABS: 9.3750 mg | ORAL_TABLET | Freq: Every day | ORAL | 1 refills | Status: AC

## 2017-01-01 NOTE — Telephone Encounter (Signed)
REFILL 

## 2017-01-20 ENCOUNTER — Other Ambulatory Visit: Payer: Self-pay | Admitting: Nurse Practitioner

## 2017-01-23 DIAGNOSIS — H52222 Regular astigmatism, left eye: Secondary | ICD-10-CM | POA: Diagnosis not present

## 2017-01-23 DIAGNOSIS — H524 Presbyopia: Secondary | ICD-10-CM | POA: Diagnosis not present

## 2017-01-23 DIAGNOSIS — H5203 Hypermetropia, bilateral: Secondary | ICD-10-CM | POA: Diagnosis not present

## 2017-01-23 DIAGNOSIS — H2513 Age-related nuclear cataract, bilateral: Secondary | ICD-10-CM | POA: Diagnosis not present

## 2017-01-25 IMAGING — NM NM MISC PROCEDURE
6 series · 36 of 36 positions shown · non-contrast
Comparison: none

[Series 1: wbr rest · 6.40mm/px · 6 of 62 frames shown]
[frame 6/62]
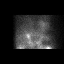
[frame 16/62]
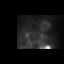
[frame 26/62]
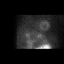
[frame 37/62]
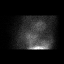
[frame 47/62]
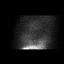
[frame 57/62]
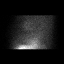

[Series 1: wbr_r-proj_st wbr rest · 6.40mm/px · 6 of 64 frames shown]
[frame 6/64]
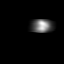
[frame 16/64]
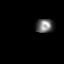
[frame 27/64]
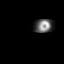
[frame 38/64]
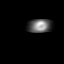
[frame 48/64]
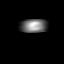
[frame 59/64]
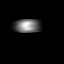

[Series 2: wbr_s-proj_st wbr stress-gsp · 6.40mm/px · 6 of 512 frames shown]
[frame 43/512]
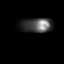
[frame 128/512]
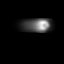
[frame 214/512]
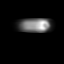
[frame 299/512]
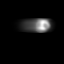
[frame 384/512]
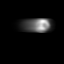
[frame 470/512]
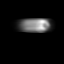

[Series 2: wbr stress-gsp · 6.40mm/px · 6 of 512 frames shown]
[frame 43/512]
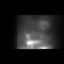
[frame 128/512]
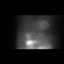
[frame 214/512]
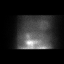
[frame 299/512]
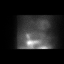
[frame 384/512]
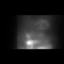
[frame 470/512]
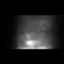

[Series 3: wbr_s-proj_st wbr stress-sum-em · 6.40mm/px · 6 of 64 frames shown]
[frame 6/64]
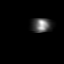
[frame 16/64]
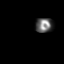
[frame 27/64]
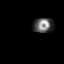
[frame 38/64]
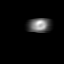
[frame 48/64]
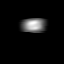
[frame 59/64]
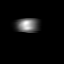

[Series 3: wbr stress-sum-em · 6.40mm/px · 6 of 64 frames shown]
[frame 6/64]
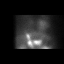
[frame 16/64]
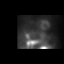
[frame 27/64]
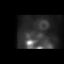
[frame 38/64]
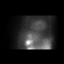
[frame 48/64]
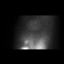
[frame 59/64]
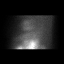

[36 of 36 positions shown; findings below may reference images not displayed]

Canned report from images found in remote index.

Refer to host system for actual result text.

## 2017-01-28 DIAGNOSIS — F332 Major depressive disorder, recurrent severe without psychotic features: Secondary | ICD-10-CM | POA: Diagnosis not present

## 2017-02-24 DIAGNOSIS — F332 Major depressive disorder, recurrent severe without psychotic features: Secondary | ICD-10-CM | POA: Diagnosis not present

## 2017-03-20 ENCOUNTER — Other Ambulatory Visit: Payer: Self-pay | Admitting: *Deleted

## 2017-03-20 MED ORDER — LOSARTAN POTASSIUM 25 MG PO TABS: 25.0000 mg | ORAL_TABLET | Freq: Every day | ORAL | 2 refills | Status: AC

## 2017-03-23 NOTE — Progress Notes (Signed)
Office Visit    Patient Name: Shawna Sparks Date of Encounter: 03/26/2017  Primary Care Provider:  Leanna Battles, MD Primary Cardiologist:  P. Martinique, MD   Chief Complaint    67 year old female status post anterior STEMI in August 2017, with history of diabetes, hyperlipidemia, ischemic cardiomyopathy, chronic systolic CHF, and LV apical thrombus who presents for follow-up.  Past Medical History    Past Medical History:  Diagnosis Date  . Adrenal adenoma right   accidental finding 6 yrs ago-   . Arthritis    knees  . Attention deficit disorder (ADD) t  . CAD (coronary artery disease)    a. 09/2015 Ant STEMI/PCI: LM nl, LAD 100p (2.5x38 Synergy DES - 3.0), 90d (PTCA), D1 90, lat branch 99, LCX nl, OM1 70, OM3 70, RCA 20p, RPLB3 100;  b. 02/2016 Lexi MV: Ef 45%, large mid ant/antsept/inf/apical ant/apical septa/apical inf/apical lateral/apical defect with partial reversibility.  . Chronic combined systolic and diastolic CHF (congestive heart failure) ([place])    a. 09/2015 Echo: EF 35-40%, Gr1 DD;  b. 01/19/2016 Echo: EF 45%, distal inf/infseptal, apical AK, Gr1 DD.  Marland Kitchen Depression   . Diabetes mellitus    a. on Insulin, A1c 11.7 in 09/2015  . High cholesterol    a. myalgias w/ lipitor - tolerating crestor.  . Insomnia   . Ischemic cardiomyopathy    a. 09/2015 Echo: EF 35-40%, mid-apical anteroseptal, infsept, infap, antap, and apical AK, Gr1 DD, ? apical thrombus, triv MR, mild TR.  . LV (left ventricular) mural thrombus following MI ([place])    a. Dx 09/2015 following Ant STEMI-->coumadin;  b. 01/2016 Echo: Ef 45%, no LV thrombus.  . Ureteral stone left  . Urinary incontinence    Past Surgical History:  Procedure Laterality Date  . CARDIAC CATHETERIZATION  2000   wnl  . CARDIAC CATHETERIZATION N/A 09/20/2015   Procedure: Left Heart Cath and Coronary Angiography;  Surgeon: Avonte Sensabaugh M Martinique, MD;  Location: [facility];  Service: Cardiovascular;  Laterality: N/A;  . CARDIAC  CATHETERIZATION N/A 09/20/2015   Procedure: Coronary Stent Intervention;  Surgeon: Ulice Follett M Martinique, MD;  Location: [facility];  Service: Cardiovascular;  Laterality: N/A;  . CHOLECYSTECTOMY  1973  . CYSTOSCOPY/RETROGRADE/URETEROSCOPY  12/27/2010   Procedure: CYSTOSCOPY/RETROGRADE/URETEROSCOPY;  Surgeon: Malka So;  Location: [facility];  Service: Urology;  Laterality: N/A;  cystoscopy left retrograde ureterocopy stone exstraction poss holmium laser  . GASTRIC BYPASS  2004  . LUMBAR DISC SURGERY    . LUMBAR FUSION  1997   L 4 - 5  . LUMBAR MICRODISCECTOMY  1996   L4 - 5  . VAGINAL HYSTERECTOMY  1996    Allergies  Allergies  Allergen Reactions  . Codeine Shortness Of Breath  . Benadryl [Diphenhydramine Hcl (Sleep)] Itching  . Dilaudid [Hydromorphone Hcl] Rash  . Flexeril [Cyclobenzaprine] Hives, Itching and Rash    History of Present Illness    67 year old female with the above, complex past medical history including coronary artery disease status post anterior MI in August 2017 requiring stenting of the proximal LAD and PTCA of the distal LAD. She also had small vessel disease including the diagonal branch, OM1, OM 3, and RPLB3. EF was 35-40%.   She had DOE in the setting of 30 lbs wt gain and deconditioning.   Echo on 12/8 showed stable LV dysfxn with an EF of 45%.  LV thrombus had resolved.  When seen 12/13  she continued  to have DOE.  She also reported some atypical/fleeting c/p.  BNP was only mildly elevated and added lasix 20 mg daily with improved exercise tolerance.     A Myoview study  was performed and was abnl with a large area of infarct and mild peri-infarct ischemia.  EF45%. She has known small vessel dzs.    On follow up today she states she is doing well. Denies any significant chest pain or dyspnea. She is moving to [place] now to the [place] area.  No edema.  She denies palpitations, pnd, orthopnea, n, v, dizziness, syncope, edema, weight gain, or  early satiety. She is planning on doing more swimming in [place]. She does note she had a bout of depression/anxiety but this is well controlled now.   Home Medications    Prior to Admission medications   Medication Sig Start Date End Date Taking? Authorizing Provider  aspirin EC 81 MG tablet Take 81 mg by mouth daily.   Yes Historical Provider, MD  carvedilol (COREG) 3.125 MG tablet Take one tablet in the AM and take two tablets in the PM 12/06/15  Yes Jettie Booze, MD  citalopram (CELEXA) 40 MG tablet Take 20 mg by mouth daily.  08/14/15  Yes Historical Provider, MD  fluconazole (DIFLUCAN) 150 MG tablet 150 mg (1 tablet) daily as needed for yeast 11/11/14  Yes Historical Provider, MD  fluocinonide (LIDEX) 0.05 % external solution Apply 1 application topically at bedtime as needed (for scaly itchy skiin).  08/14/15  Yes Historical Provider, MD  furosemide (LASIX) 20 MG tablet Take 1 tablet (20 mg total) by mouth daily. 03/06/16 06/04/16 Yes Rogelia Mire, NP  NOVOLOG 100 UNIT/ML injection per sliding scale 11/11/14  Yes Historical Provider, MD  ticagrelor (BRILINTA) 90 MG TABS tablet Take 1 tablet (90 mg total) by mouth 2 (two) times daily. 12/06/15  Yes Jettie Booze, MD  traZODone (DESYREL) 100 MG tablet TAKE 1 TABLET BY MOUTH AT BEDTIME 01/15/16  Yes Larey Seat, MD  valACYclovir (VALTREX) 500 MG tablet Take 1,000 mg by mouth 2 (two) times daily as needed (for outbreaks).  08/14/15  Yes Historical Provider, MD  vitamin B-12 (CYANOCOBALAMIN) 1000 MCG tablet Take 1,000 mcg by mouth daily.   Yes Historical Provider, MD  Vitamin D, Ergocalciferol, (DRISDOL) 50000 UNITS CAPS capsule Take 1 capsule (50,000 units) every Friday 09/07/14  Yes Historical Provider, MD  lisinopril (PRINIVIL,ZESTRIL) 2.5 MG tablet Take 1 tablet (2.5 mg total) by mouth daily. 12/06/15 03/05/16  Jettie Booze, MD  nitroGLYCERIN (NITROSTAT) 0.4 MG SL tablet Place 1 tablet (0.4 mg total) under the tongue every 5  (five) minutes as needed for chest pain. 12/06/15 03/05/16  Brittainy M Simmons, PA-C  rosuvastatin (CRESTOR) 20 MG tablet Take 1 tablet (20 mg total) by mouth daily. 12/06/15 03/05/16  Jettie Booze, MD    Review of Systems    As note in HPI.   All other systems reviewed and are otherwise negative except as noted above.  Physical Exam    VS:  BP 114/80   Pulse 82   Ht 5\' 4"  (1.626 m)   Wt 199 lb 3.2 oz (90.4 kg)   BMI 34.19 kg/m  , BMI Body mass index is 34.19 kg/m.  GENERAL:  Well appearing obese WF in NAD HEENT:  PERRL, EOMI, sclera are clear. Oropharynx is clear. NECK:  No jugular venous distention, carotid upstroke brisk and symmetric, no bruits, no thyromegaly or adenopathy LUNGS:  Clear to auscultation  bilaterally CHEST:  Unremarkable HEART:  RRR,  PMI not displaced or sustained,S1 and S2 within normal limits, no S3, no S4: no clicks, no rubs, no murmurs ABD:  Soft, nontender. BS +, no masses or bruits. No hepatomegaly, no splenomegaly EXT:  2 + pulses throughout, no edema, no cyanosis no clubbing SKIN:  Warm and dry.  No rashes NEURO:  Alert and oriented x 3. Cranial nerves II through XII intact. PSYCH:  Cognitively intact      Accessory Clinical Findings   Lab Results  Component Value Date   WBC 7.6 01/24/2016   HGB 16.5 (H) 01/24/2016   HCT 47.7 (H) 01/24/2016   PLT 279 01/24/2016   GLUCOSE 314 (H) 03/27/2016   CHOL 170 03/27/2016   TRIG 145 03/27/2016   HDL 48 (L) 03/27/2016   LDLCALC 93 03/27/2016   ALT 19 03/27/2016   AST 17 03/27/2016   NA 139 03/27/2016   K 4.2 03/27/2016   CL 102 03/27/2016   CREATININE 0.65 03/27/2016   BUN 11 03/27/2016   CO2 29 03/27/2016   TSH 2.613 09/20/2015   INR 1.7 01/24/2016   HGBA1C 9.0 (H) 12/19/2015   Dated 02/17/17: cholesterol 178, triglycerides 155, HDL 53, LDL 94. A1c 11.8%. Chemistries normal.   Ecg today shows NSR with old anterior infarct. I have personally reviewed and interpreted this study.     Echo 01/19/16: Study Conclusions  - Procedure narrative: Intravenous contrast (Definity) was   administered. - Left ventricle: LVEF is approximately 45% with akinesis of the   distal inferior/ inferoseptal wall and apex. The cavity size was   normal. Wall thickness was normal. Doppler parameters are   consistent with abnormal left ventricular relaxation (grade 1   diastolic dysfunction).  Myoview 02/20/16: Study Highlights    The left ventricular ejection fraction is mildly decreased (45-54%).  Nuclear stress EF: 45%.  There are Q waves in the anterolateral leads.  There was no ST segment deviation noted during stress.  There is a large defect of severe severity present in the mid anterior, mid anteroseptal, mid inferior, apical anterior, apical septal, apical inferior, apical lateral and apex location. The defect is partially reversible.  This is a high risk study.    Assessment & Plan    1.  CAD:  S/p anterior MI 09/2015 with DES to the LAD and PTCA to the distal LAD.   Myoview  One year ago showed a large area of infarct with peri-infarct ischemia.  EF 45%. She has known small vessel dzs involving the D1, OM1, OM3, and RPLB3.  She is asymptomatic.  We will continue medical therapy at this time.   She remains on  blocker, acei, and statin.   2.  ICM/Chronic combined systolic and diastolic CHF:  EF 30% by echo in December. Now Class 2. She is euvolemic on exam today.  She remains on  blocker and acei.   Strongly recommend increased aerobic activity and weight loss.   3.  LV apical thrombus:  Resolved on echo in December 2018.  Coumadin d/c'd @ that time.  4.  Insulin dependent DM:  Poorly controlled. Followed by PCP.    5.  HL:  Tolerating Crestor well.  She is on maximum dose of Crestor. Needs to focus on weight loss and diabetic control. Could consider adding Zetia or PCSK 9 inhibitor but cost is a consideration  6.  Hypertensive Heart Disease- BP controlled.  Follow up PRN  since she is moving to [facility].  Jowel Waltner Martinique, MD,FACC 03/26/2017, 10:53 AM

## 2017-03-24 DIAGNOSIS — F332 Major depressive disorder, recurrent severe without psychotic features: Secondary | ICD-10-CM | POA: Diagnosis not present

## 2017-03-26 ENCOUNTER — Ambulatory Visit (INDEPENDENT_AMBULATORY_CARE_PROVIDER_SITE_OTHER): Payer: Medicare Other | Admitting: Cardiology

## 2017-03-26 ENCOUNTER — Encounter: Payer: Self-pay | Admitting: Cardiology

## 2017-03-26 VITALS — BP 114/80 | HR 82 | Ht 64.0 in | Wt 199.2 lb

## 2017-03-26 DIAGNOSIS — I10 Essential (primary) hypertension: Secondary | ICD-10-CM

## 2017-03-26 DIAGNOSIS — I25118 Atherosclerotic heart disease of native coronary artery with other forms of angina pectoris: Secondary | ICD-10-CM | POA: Diagnosis not present

## 2017-03-26 DIAGNOSIS — E782 Mixed hyperlipidemia: Secondary | ICD-10-CM

## 2017-03-26 DIAGNOSIS — I5022 Chronic systolic (congestive) heart failure: Secondary | ICD-10-CM

## 2017-03-26 NOTE — Addendum Note (Signed)
Addended by: Kathyrn Lass on: 03/26/2017 10:58 AM   Modules accepted: Orders

## 2017-03-26 NOTE — Patient Instructions (Signed)
Continue your current therapy  Good luck with your move   

## 2017-07-23 DIAGNOSIS — E559 Vitamin D deficiency, unspecified: Secondary | ICD-10-CM | POA: Diagnosis not present

## 2017-07-23 DIAGNOSIS — Z9884 Bariatric surgery status: Secondary | ICD-10-CM | POA: Diagnosis not present

## 2017-07-23 DIAGNOSIS — E538 Deficiency of other specified B group vitamins: Secondary | ICD-10-CM | POA: Diagnosis not present

## 2017-07-23 DIAGNOSIS — E119 Type 2 diabetes mellitus without complications: Secondary | ICD-10-CM | POA: Diagnosis not present

## 2017-07-23 DIAGNOSIS — M1990 Unspecified osteoarthritis, unspecified site: Secondary | ICD-10-CM | POA: Diagnosis not present

## 2017-07-23 DIAGNOSIS — Z23 Encounter for immunization: Secondary | ICD-10-CM | POA: Diagnosis not present

## 2017-07-23 DIAGNOSIS — G4700 Insomnia, unspecified: Secondary | ICD-10-CM | POA: Diagnosis not present

## 2017-07-23 DIAGNOSIS — E785 Hyperlipidemia, unspecified: Secondary | ICD-10-CM | POA: Diagnosis not present

## 2017-07-23 DIAGNOSIS — I2510 Atherosclerotic heart disease of native coronary artery without angina pectoris: Secondary | ICD-10-CM | POA: Diagnosis not present

## 2017-07-23 DIAGNOSIS — I509 Heart failure, unspecified: Secondary | ICD-10-CM | POA: Diagnosis not present

## 2017-07-23 DIAGNOSIS — I10 Essential (primary) hypertension: Secondary | ICD-10-CM | POA: Diagnosis not present

## 2017-07-23 DIAGNOSIS — F418 Other specified anxiety disorders: Secondary | ICD-10-CM | POA: Diagnosis not present

## 2017-09-05 DIAGNOSIS — Z23 Encounter for immunization: Secondary | ICD-10-CM | POA: Diagnosis not present

## 2017-09-05 DIAGNOSIS — E785 Hyperlipidemia, unspecified: Secondary | ICD-10-CM | POA: Diagnosis not present

## 2017-09-05 DIAGNOSIS — E119 Type 2 diabetes mellitus without complications: Secondary | ICD-10-CM | POA: Diagnosis not present

## 2017-09-05 DIAGNOSIS — M1990 Unspecified osteoarthritis, unspecified site: Secondary | ICD-10-CM | POA: Diagnosis not present

## 2017-09-05 DIAGNOSIS — I10 Essential (primary) hypertension: Secondary | ICD-10-CM | POA: Diagnosis not present

## 2017-09-05 DIAGNOSIS — I509 Heart failure, unspecified: Secondary | ICD-10-CM | POA: Diagnosis not present

## 2017-09-05 DIAGNOSIS — F418 Other specified anxiety disorders: Secondary | ICD-10-CM | POA: Diagnosis not present

## 2017-09-05 DIAGNOSIS — E559 Vitamin D deficiency, unspecified: Secondary | ICD-10-CM | POA: Diagnosis not present

## 2017-09-05 DIAGNOSIS — Z9884 Bariatric surgery status: Secondary | ICD-10-CM | POA: Diagnosis not present

## 2017-09-05 DIAGNOSIS — G4700 Insomnia, unspecified: Secondary | ICD-10-CM | POA: Diagnosis not present

## 2017-09-05 DIAGNOSIS — I2510 Atherosclerotic heart disease of native coronary artery without angina pectoris: Secondary | ICD-10-CM | POA: Diagnosis not present

## 2017-09-05 DIAGNOSIS — E538 Deficiency of other specified B group vitamins: Secondary | ICD-10-CM | POA: Diagnosis not present

## 2017-12-05 DIAGNOSIS — E538 Deficiency of other specified B group vitamins: Secondary | ICD-10-CM | POA: Diagnosis not present

## 2017-12-05 DIAGNOSIS — I509 Heart failure, unspecified: Secondary | ICD-10-CM | POA: Diagnosis not present

## 2017-12-05 DIAGNOSIS — M1990 Unspecified osteoarthritis, unspecified site: Secondary | ICD-10-CM | POA: Diagnosis not present

## 2017-12-05 DIAGNOSIS — E559 Vitamin D deficiency, unspecified: Secondary | ICD-10-CM | POA: Diagnosis not present

## 2017-12-05 DIAGNOSIS — F418 Other specified anxiety disorders: Secondary | ICD-10-CM | POA: Diagnosis not present

## 2017-12-05 DIAGNOSIS — Z23 Encounter for immunization: Secondary | ICD-10-CM | POA: Diagnosis not present

## 2017-12-05 DIAGNOSIS — G4700 Insomnia, unspecified: Secondary | ICD-10-CM | POA: Diagnosis not present

## 2017-12-05 DIAGNOSIS — E785 Hyperlipidemia, unspecified: Secondary | ICD-10-CM | POA: Diagnosis not present

## 2017-12-05 DIAGNOSIS — E119 Type 2 diabetes mellitus without complications: Secondary | ICD-10-CM | POA: Diagnosis not present

## 2017-12-05 DIAGNOSIS — Z9884 Bariatric surgery status: Secondary | ICD-10-CM | POA: Diagnosis not present

## 2017-12-05 DIAGNOSIS — I10 Essential (primary) hypertension: Secondary | ICD-10-CM | POA: Diagnosis not present

## 2017-12-05 DIAGNOSIS — I2510 Atherosclerotic heart disease of native coronary artery without angina pectoris: Secondary | ICD-10-CM | POA: Diagnosis not present

## 2018-01-19 DIAGNOSIS — E559 Vitamin D deficiency, unspecified: Secondary | ICD-10-CM | POA: Diagnosis not present

## 2018-01-19 DIAGNOSIS — E785 Hyperlipidemia, unspecified: Secondary | ICD-10-CM | POA: Diagnosis not present

## 2018-01-19 DIAGNOSIS — E538 Deficiency of other specified B group vitamins: Secondary | ICD-10-CM | POA: Diagnosis not present

## 2018-01-19 DIAGNOSIS — E119 Type 2 diabetes mellitus without complications: Secondary | ICD-10-CM | POA: Diagnosis not present

## 2018-01-19 DIAGNOSIS — I10 Essential (primary) hypertension: Secondary | ICD-10-CM | POA: Diagnosis not present

## 2018-01-22 DIAGNOSIS — E559 Vitamin D deficiency, unspecified: Secondary | ICD-10-CM | POA: Diagnosis not present

## 2018-01-22 DIAGNOSIS — E785 Hyperlipidemia, unspecified: Secondary | ICD-10-CM | POA: Diagnosis not present

## 2018-01-22 DIAGNOSIS — I2510 Atherosclerotic heart disease of native coronary artery without angina pectoris: Secondary | ICD-10-CM | POA: Diagnosis not present

## 2018-01-22 DIAGNOSIS — I10 Essential (primary) hypertension: Secondary | ICD-10-CM | POA: Diagnosis not present

## 2018-01-22 DIAGNOSIS — E538 Deficiency of other specified B group vitamins: Secondary | ICD-10-CM | POA: Diagnosis not present

## 2018-01-22 DIAGNOSIS — I509 Heart failure, unspecified: Secondary | ICD-10-CM | POA: Diagnosis not present

## 2018-01-22 DIAGNOSIS — F418 Other specified anxiety disorders: Secondary | ICD-10-CM | POA: Diagnosis not present

## 2018-01-22 DIAGNOSIS — E119 Type 2 diabetes mellitus without complications: Secondary | ICD-10-CM | POA: Diagnosis not present

## 2018-01-22 DIAGNOSIS — Z9884 Bariatric surgery status: Secondary | ICD-10-CM | POA: Diagnosis not present

## 2018-01-22 DIAGNOSIS — G4700 Insomnia, unspecified: Secondary | ICD-10-CM | POA: Diagnosis not present

## 2018-01-22 DIAGNOSIS — M1990 Unspecified osteoarthritis, unspecified site: Secondary | ICD-10-CM | POA: Diagnosis not present

## 2018-01-22 DIAGNOSIS — Z23 Encounter for immunization: Secondary | ICD-10-CM | POA: Diagnosis not present
